# Patient Record
Sex: Female | Born: 1975
Health system: Southern US, Community
[De-identification: ages and names within clinical notes are randomized; demographics above are authoritative.]

## PROBLEM LIST (undated history)

## (undated) DIAGNOSIS — C801 Malignant (primary) neoplasm, unspecified: Secondary | ICD-10-CM

## (undated) DIAGNOSIS — T4145XA Adverse effect of unspecified anesthetic, initial encounter: Secondary | ICD-10-CM

## (undated) DIAGNOSIS — D259 Leiomyoma of uterus, unspecified: Secondary | ICD-10-CM

## (undated) DIAGNOSIS — T8859XA Other complications of anesthesia, initial encounter: Secondary | ICD-10-CM

## (undated) DIAGNOSIS — K429 Umbilical hernia without obstruction or gangrene: Secondary | ICD-10-CM

## (undated) DIAGNOSIS — O039 Complete or unspecified spontaneous abortion without complication: Secondary | ICD-10-CM

## (undated) DIAGNOSIS — N920 Excessive and frequent menstruation with regular cycle: Secondary | ICD-10-CM

## (undated) DIAGNOSIS — Z6741 Type O blood, Rh negative: Secondary | ICD-10-CM

## (undated) HISTORY — DX: Leiomyoma of uterus, unspecified: D25.9

## (undated) HISTORY — DX: Type O blood, Rh negative: Z67.41

## (undated) HISTORY — DX: Complete or unspecified spontaneous abortion without complication: O03.9

## (undated) HISTORY — DX: Umbilical hernia without obstruction or gangrene: K42.9

## (undated) HISTORY — DX: Excessive and frequent menstruation with regular cycle: N92.0

## (undated) HISTORY — PX: WISDOM TOOTH EXTRACTION: SHX21

---
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
## 1999-09-28 ENCOUNTER — Other Ambulatory Visit: Admission: RE | Admit: 1999-09-28 | Discharge: 1999-09-28 | Payer: Self-pay | Admitting: Obstetrics and Gynecology

## 2001-11-07 ENCOUNTER — Other Ambulatory Visit: Admission: RE | Admit: 2001-11-07 | Discharge: 2001-11-07 | Payer: Self-pay | Admitting: Obstetrics and Gynecology

## 2002-03-14 ENCOUNTER — Ambulatory Visit (HOSPITAL_COMMUNITY): Admission: RE | Admit: 2002-03-14 | Discharge: 2002-03-14 | Payer: Self-pay | Admitting: Obstetrics and Gynecology

## 2002-05-19 ENCOUNTER — Inpatient Hospital Stay (HOSPITAL_COMMUNITY): Admission: AD | Admit: 2002-05-19 | Discharge: 2002-05-19 | Payer: Self-pay | Admitting: Obstetrics and Gynecology

## 2002-05-21 ENCOUNTER — Inpatient Hospital Stay (HOSPITAL_COMMUNITY): Admission: AD | Admit: 2002-05-21 | Discharge: 2002-05-26 | Payer: Self-pay | Admitting: Obstetrics and Gynecology

## 2002-06-03 ENCOUNTER — Encounter: Admission: RE | Admit: 2002-06-03 | Discharge: 2002-07-03 | Payer: Self-pay | Admitting: Obstetrics and Gynecology

## 2002-07-03 ENCOUNTER — Other Ambulatory Visit: Admission: RE | Admit: 2002-07-03 | Discharge: 2002-07-03 | Payer: Self-pay | Admitting: Obstetrics and Gynecology

## 2003-07-21 ENCOUNTER — Other Ambulatory Visit: Admission: RE | Admit: 2003-07-21 | Discharge: 2003-07-21 | Payer: Self-pay | Admitting: Obstetrics and Gynecology

## 2003-11-05 ENCOUNTER — Ambulatory Visit (HOSPITAL_COMMUNITY): Admission: RE | Admit: 2003-11-05 | Discharge: 2003-11-05 | Payer: Self-pay | Admitting: Obstetrics and Gynecology

## 2004-02-03 ENCOUNTER — Inpatient Hospital Stay (HOSPITAL_COMMUNITY): Admission: RE | Admit: 2004-02-03 | Discharge: 2004-02-06 | Payer: Self-pay | Admitting: Obstetrics and Gynecology

## 2004-02-07 ENCOUNTER — Encounter: Admission: RE | Admit: 2004-02-07 | Discharge: 2004-03-08 | Payer: Self-pay | Admitting: Obstetrics and Gynecology

## 2004-03-08 ENCOUNTER — Other Ambulatory Visit: Admission: RE | Admit: 2004-03-08 | Discharge: 2004-03-08 | Payer: Self-pay | Admitting: Obstetrics and Gynecology

## 2004-03-09 ENCOUNTER — Encounter: Admission: RE | Admit: 2004-03-09 | Discharge: 2004-04-08 | Payer: Self-pay | Admitting: Obstetrics and Gynecology

## 2004-05-09 ENCOUNTER — Encounter: Admission: RE | Admit: 2004-05-09 | Discharge: 2004-06-08 | Payer: Self-pay | Admitting: Obstetrics and Gynecology

## 2004-06-09 ENCOUNTER — Encounter: Admission: RE | Admit: 2004-06-09 | Discharge: 2004-07-09 | Payer: Self-pay | Admitting: Obstetrics and Gynecology

## 2006-02-24 ENCOUNTER — Ambulatory Visit: Payer: Self-pay

## 2006-06-28 ENCOUNTER — Inpatient Hospital Stay: Payer: Self-pay | Admitting: Obstetrics & Gynecology

## 2009-03-25 ENCOUNTER — Ambulatory Visit: Payer: Self-pay | Admitting: Obstetrics and Gynecology

## 2009-03-25 HISTORY — PX: HYSTEROSCOPY: SHX211

## 2009-09-18 ENCOUNTER — Ambulatory Visit: Payer: Self-pay

## 2010-04-23 ENCOUNTER — Other Ambulatory Visit: Payer: Self-pay | Admitting: Obstetrics & Gynecology

## 2011-08-02 ENCOUNTER — Other Ambulatory Visit: Payer: Self-pay | Admitting: Internal Medicine

## 2011-08-02 LAB — CBC WITH DIFFERENTIAL/PLATELET
Basophil #: 0.1 10*3/uL (ref 0.0–0.1)
Basophil %: 1.3 %
Eosinophil #: 0.1 10*3/uL (ref 0.0–0.7)
Eosinophil %: 0.9 %
HCT: 41 % (ref 35.0–47.0)
HGB: 13.8 g/dL (ref 12.0–16.0)
Lymphocyte #: 1.8 10*3/uL (ref 1.0–3.6)
Lymphocyte %: 30.6 %
MCH: 31.5 pg (ref 26.0–34.0)
MCHC: 33.8 g/dL (ref 32.0–36.0)
MCV: 93 fL (ref 80–100)
Monocyte #: 0.5 10*3/uL (ref 0.0–0.7)
Monocyte %: 9 %
Neutrophil #: 3.4 10*3/uL (ref 1.4–6.5)
Neutrophil %: 58.2 %
Platelet: 192 10*3/uL (ref 150–440)
RBC: 4.39 10*6/uL (ref 3.80–5.20)
RDW: 12.6 % (ref 11.5–14.5)
WBC: 5.9 10*3/uL (ref 3.6–11.0)

## 2011-08-02 LAB — URINALYSIS, COMPLETE
Bilirubin,UR: NEGATIVE
Blood: NEGATIVE
Glucose,UR: NEGATIVE mg/dL (ref 0–75)
Ketone: NEGATIVE
Leukocyte Esterase: NEGATIVE
Nitrite: NEGATIVE
Ph: 8 (ref 4.5–8.0)
Protein: NEGATIVE
RBC,UR: 1 /HPF (ref 0–5)
Specific Gravity: 1.003 (ref 1.003–1.030)
Squamous Epithelial: 12
WBC UR: 3 /HPF (ref 0–5)

## 2013-10-29 ENCOUNTER — Ambulatory Visit (INDEPENDENT_AMBULATORY_CARE_PROVIDER_SITE_OTHER): Payer: 59 | Admitting: Podiatry

## 2013-10-29 ENCOUNTER — Encounter: Payer: Self-pay | Admitting: Podiatry

## 2013-10-29 ENCOUNTER — Ambulatory Visit (INDEPENDENT_AMBULATORY_CARE_PROVIDER_SITE_OTHER): Payer: 59

## 2013-10-29 ENCOUNTER — Other Ambulatory Visit: Payer: Self-pay | Admitting: *Deleted

## 2013-10-29 VITALS — BP 119/75 | HR 78 | Resp 16 | Ht 64.0 in | Wt 120.0 lb

## 2013-10-29 DIAGNOSIS — L600 Ingrowing nail: Secondary | ICD-10-CM

## 2013-10-29 DIAGNOSIS — M2040 Other hammer toe(s) (acquired), unspecified foot: Secondary | ICD-10-CM

## 2013-10-29 DIAGNOSIS — M2042 Other hammer toe(s) (acquired), left foot: Secondary | ICD-10-CM

## 2013-10-29 MED ORDER — NEOMYCIN-POLYMYXIN-HC 3.5-10000-1 OT SOLN: OTIC | Status: DC

## 2013-10-29 NOTE — Telephone Encounter (Signed)
Pt made an error and said [facility]. Should be [facility] for ear drops.

## 2013-10-29 NOTE — Patient Instructions (Signed)

## 2013-10-29 NOTE — Progress Notes (Signed)
   Subjective:    Patient ID: Jessica Murphy, female    DOB: [birth year], 38 y.o.   MRN: [id number]  HPI Comments: Two weeks ago the 4th toe on left foot hurt , it came on all of sudden ,but the 5th toe lateral corner has a callused lesion on the toe as well. i am a runner didn't know if that may be causing the problem      Review of Systems  All other systems reviewed and are negative.      Objective:   Physical Exam: I have reviewed her past medical history medications allergies surgeries social history and review of systems. Pulses are strongly palpable bilateral. Capillary fill time to digits one through 5 of the bilateral foot is immediate. Neurologic sensorium is intact per since once the monofilament bilateral foot. He can reflexes are intact bilateral muscle strength is 5 over 5 dorsiflexors plantar flexors inverters everters all intrinsic musculature is intact. Orthopedic evaluation demonstrates all joints distal to the ankle have a full range of motion without crepitation she does have adductovarus rotated hammertoe deformities fourth and fifth toes bilateral. Cutaneous evaluation demonstrates supple well hydrated cutis she has small hangnails and because of ingrown nail to the fibular border of the fourth and fifth digits of the left foot.        Assessment & Plan:  Assessment: Ingrown nails paronychia abscess lateral borders fourth and fifth toes left foot.  Plan: Chemical matrixectomy was tolerated well after local anesthetic was provided and fifth digits of the left foot she was her soaking on a twice a day basis and Betadine and water and apply Cortisporin otic as directed I will followup with her in one week

## 2013-11-05 ENCOUNTER — Ambulatory Visit: Payer: 59 | Admitting: Podiatry

## 2013-11-12 ENCOUNTER — Ambulatory Visit (INDEPENDENT_AMBULATORY_CARE_PROVIDER_SITE_OTHER): Payer: 59 | Admitting: Podiatry

## 2013-11-12 VITALS — BP 103/54 | HR 74 | Resp 16

## 2013-11-12 DIAGNOSIS — L600 Ingrowing nail: Secondary | ICD-10-CM

## 2013-11-12 NOTE — Progress Notes (Signed)
She presents today for followup of a matrixectomy to the fourth and fifth digits of the left foot. She states that they're a little bit sore and states that she has not been soaking than likely.  Objective: Vital signs are stable she is alert and oriented x3. There is no erythema edema saline is drainage or odor scab covered fibular borders for the matrixectomy for performed a vascular signs of infection.  Assessment: Well-healing surgical toes fourth and fifth of the left foot. Status post matrixectomy.  Plan: Suggested she start soaking in Epsom salts warm water and I will followup with her as needed. She will continue to soak and to completely healed.

## 2014-08-10 ENCOUNTER — Encounter: Payer: Self-pay | Admitting: *Deleted

## 2014-11-05 ENCOUNTER — Ambulatory Visit (INDEPENDENT_AMBULATORY_CARE_PROVIDER_SITE_OTHER): Payer: 59 | Admitting: Podiatry

## 2014-11-05 ENCOUNTER — Encounter: Payer: Self-pay | Admitting: Podiatry

## 2014-11-05 VITALS — BP 127/83 | HR 72 | Resp 12

## 2014-11-05 DIAGNOSIS — L600 Ingrowing nail: Secondary | ICD-10-CM

## 2014-11-05 DIAGNOSIS — L03012 Cellulitis of left finger: Secondary | ICD-10-CM

## 2014-11-05 MED ORDER — CEPHALEXIN 500 MG PO CAPS: 500.0000 mg | ORAL_CAPSULE | Freq: Three times a day (TID) | ORAL | Status: DC

## 2014-11-05 NOTE — Progress Notes (Signed)
   Subjective:    Patient ID: Jessica Murphy, female    DOB: Jun 08, 1975, 39 y.o.   MRN: [id number]  HPI  PT STATED LT FOOT GREAT TOENAIL SORE IS BEEN SORE 2.5 WEEKS. THE TOENAIL IS A LITTLE BETTER BUT WHEN PRESSURE IT GET WORSE. TRIED MOTRIN FOR 2 WEEKS BUT NO HELP.  Review of Systems  Skin: Positive for color change.       Objective:   Physical Exam: I have reviewed her past medical history medications allergy surgery social history and review of systems. Pulses are strongly palpable. Neurologic sensorium is intact per Semmes-Weinstein monofilament. Deep tendon reflexes are intact. Muscle strength full and equal bilateral. All intrinsic musculature of the foot is intact. Cutaneous evaluation demonstrates mild paronychia tibial border hallux left. Mild ingrown toenail. It appears to have an abrasion associated with it. Distal medial tuft.        Assessment & Plan:  Assessment: Paronychia ingrown nail hallux left.  Plan: At this point she states that she is going to the toes and cannot have anything invasive performed at this point. We both agreed that at the very least an antibiotic-coated be necessary. We prescribed Keflex 500 mg 1 by mouth 3 times a day 30 I also encouraged her to soak the toe in Epsom salts and warm water. I will follow-up with her when she returns.

## 2015-08-02 DIAGNOSIS — O09529 Supervision of elderly multigravida, unspecified trimester: Secondary | ICD-10-CM | POA: Diagnosis not present

## 2015-08-02 DIAGNOSIS — Z3480 Encounter for supervision of other normal pregnancy, unspecified trimester: Secondary | ICD-10-CM | POA: Diagnosis not present

## 2015-08-02 DIAGNOSIS — D259 Leiomyoma of uterus, unspecified: Secondary | ICD-10-CM | POA: Diagnosis not present

## 2015-08-02 DIAGNOSIS — O34219 Maternal care for unspecified type scar from previous cesarean delivery: Secondary | ICD-10-CM | POA: Diagnosis not present

## 2015-08-02 DIAGNOSIS — Z124 Encounter for screening for malignant neoplasm of cervix: Secondary | ICD-10-CM | POA: Diagnosis not present

## 2015-08-02 DIAGNOSIS — Z113 Encounter for screening for infections with a predominantly sexual mode of transmission: Secondary | ICD-10-CM | POA: Diagnosis not present

## 2015-08-02 DIAGNOSIS — O3411 Maternal care for benign tumor of corpus uteri, first trimester: Secondary | ICD-10-CM | POA: Diagnosis not present

## 2015-08-24 DIAGNOSIS — Z8759 Personal history of other complications of pregnancy, childbirth and the puerperium: Secondary | ICD-10-CM | POA: Diagnosis not present

## 2015-08-24 DIAGNOSIS — Z1231 Encounter for screening mammogram for malignant neoplasm of breast: Secondary | ICD-10-CM | POA: Diagnosis not present

## 2015-08-24 DIAGNOSIS — O039 Complete or unspecified spontaneous abortion without complication: Secondary | ICD-10-CM | POA: Diagnosis not present

## 2015-08-24 DIAGNOSIS — D259 Leiomyoma of uterus, unspecified: Secondary | ICD-10-CM | POA: Diagnosis not present

## 2015-08-24 DIAGNOSIS — O09529 Supervision of elderly multigravida, unspecified trimester: Secondary | ICD-10-CM | POA: Diagnosis not present

## 2015-08-24 DIAGNOSIS — Z36 Encounter for antenatal screening of mother: Secondary | ICD-10-CM | POA: Diagnosis not present

## 2015-10-27 DIAGNOSIS — D2272 Melanocytic nevi of left lower limb, including hip: Secondary | ICD-10-CM | POA: Diagnosis not present

## 2015-10-27 DIAGNOSIS — D2262 Melanocytic nevi of left upper limb, including shoulder: Secondary | ICD-10-CM | POA: Diagnosis not present

## 2015-10-27 DIAGNOSIS — D2261 Melanocytic nevi of right upper limb, including shoulder: Secondary | ICD-10-CM | POA: Diagnosis not present

## 2015-10-27 DIAGNOSIS — D485 Neoplasm of uncertain behavior of skin: Secondary | ICD-10-CM | POA: Diagnosis not present

## 2015-10-27 DIAGNOSIS — D225 Melanocytic nevi of trunk: Secondary | ICD-10-CM | POA: Diagnosis not present

## 2016-03-15 DIAGNOSIS — H5213 Myopia, bilateral: Secondary | ICD-10-CM | POA: Diagnosis not present

## 2016-10-25 DIAGNOSIS — D2261 Melanocytic nevi of right upper limb, including shoulder: Secondary | ICD-10-CM | POA: Diagnosis not present

## 2016-10-25 DIAGNOSIS — X32XXXA Exposure to sunlight, initial encounter: Secondary | ICD-10-CM | POA: Diagnosis not present

## 2016-10-25 DIAGNOSIS — D225 Melanocytic nevi of trunk: Secondary | ICD-10-CM | POA: Diagnosis not present

## 2016-10-25 DIAGNOSIS — L814 Other melanin hyperpigmentation: Secondary | ICD-10-CM | POA: Diagnosis not present

## 2016-12-01 ENCOUNTER — Ambulatory Visit (INDEPENDENT_AMBULATORY_CARE_PROVIDER_SITE_OTHER): Payer: 59 | Admitting: Obstetrics and Gynecology

## 2016-12-01 ENCOUNTER — Encounter: Payer: Self-pay | Admitting: Obstetrics and Gynecology

## 2016-12-01 VITALS — BP 126/76 | HR 78 | Ht 64.0 in | Wt 128.0 lb

## 2016-12-01 DIAGNOSIS — D252 Subserosal leiomyoma of uterus: Secondary | ICD-10-CM | POA: Diagnosis not present

## 2016-12-01 DIAGNOSIS — D251 Intramural leiomyoma of uterus: Secondary | ICD-10-CM | POA: Diagnosis not present

## 2016-12-01 DIAGNOSIS — N920 Excessive and frequent menstruation with regular cycle: Secondary | ICD-10-CM

## 2016-12-01 MED ORDER — MEDROXYPROGESTERONE ACETATE 10 MG PO TABS: 10.0000 mg | ORAL_TABLET | Freq: Every day | ORAL | 2 refills | Status: DC

## 2016-12-01 NOTE — Progress Notes (Signed)
Obstetrics & Gynecology Office Visit   Chief Complaint:  Chief Complaint  Patient presents with  . surgical consult    possible hysterectomy/heavy cycles    History of Present Illness: 41 year old female with a long standing history of worsening menorrhagia and dysmenorrhea.  Previous evaluation has included TVUS in the past year showing a posterior subserosal 4cm fibroid and a smaller 1cm anterior intramural fibroid.  The patient has previously trialed a Mirena IUD for management of her symptoms but had to eventually have this surgically removed and has not revisited the option of IUD. She has tried OCP's, with lower dose OCP's causing issues with breakthrough bleeding, higher dose OCP's causing migraines.     At present her menses are still regular monthly lasting approximately 7-8 days, with the first 2-3 days being particularly heavy with moderate to severe dysmenorrhea.  She has not recently been checked for anemia.  The patient is amenable to all interventions but would like to know what options and entail.     Review of Systems: 10 point review of systems negative unless otherwise noted in HPI  Past Medical History:  No past medical history on file.  Past Surgical History:  Past Surgical History:  Procedure Laterality Date  . CESAREAN SECTION      Gynecologic History: Patient's last menstrual period was 11/26/2016.  Obstetric History: No obstetric history on file.  Family History:  Family History  Problem Relation Age of Onset  . Alcohol abuse Father     Social History:  Social History   Social History  . Marital status: Married    Spouse name: N/A  . Number of children: N/A  . Years of education: N/A   Occupational History  . Not on file.   Social History Main Topics  . Smoking status: Never Smoker  . Smokeless tobacco: Never Used  . Alcohol use Not on file  . Drug use: Unknown  . Sexual activity: Not on file   Other Topics Concern  . Not on file    Social History Narrative  . No narrative on file    Allergies:  No Known Allergies  Medications: Prior to Admission medications   Medication Sig Start Date End Date Taking? Authorizing Provider  medroxyPROGESTERone (PROVERA) 10 MG tablet Take 1 tablet (10 mg total) by mouth daily. 12/01/16 12/31/16  Malachy Mood, MD    Physical Exam Vitals:  Vitals:   12/01/16 1433  BP: 126/76  Pulse: 78   Patient's last menstrual period was 11/26/2016.  General: NAD HEENT: normocephalic, anicteric Pulmonary: No increased work of breathing Abdomen: soft, non-tender, non-distended, small umbilical hernia with evidence of incarceration Neurologic: Grossly intact Psychiatric: mood appropriate, affect full  Female chaperone present for pelvic and breast  portions of the physical exam  Assessment: 41 y.o. menorrhagia, uterine fibroids  Plan: Problem List Items Addressed This Visit    None    Visit Diagnoses    Menorrhagia with regular cycle    -  Primary   Relevant Orders   CBC (Completed)   TSH (Completed)   Prolactin (Completed)   Intramural and subserous leiomyoma of uterus         Discussed management options for abnormal uterine bleeding including expectant, NSAIDs, tranexamic acid (Lysteda), oral progesterone (Provera, norethindrone, megace), Depo Provera, Levonorgestrel containing IUD, endometrial ablation (Novasure) or hysterectomy as definitive surgical management.  Discussed risks and benefits of each method.   Final management decision will hinge on results of patient's  work up and whether an underlying etiology for the patients bleeding symptoms can be discerned.  We will conduct a basic work up examining using the PALM-COIEN classification system.  In the meantime the patient opts to trial provera while we await results of her ultrasound and labs.  Printed patient education handouts were given to the patient to review at home.  Bleeding precautions reviewed.  - [place] last  year reviewed - Given size of fibroids likely not a great candidate for endometrial ablation, uterine artery embolizaiton would decrease size of fibroids but may not improve bleeding patter particularly if a degree of adenomyosis is present - umbilical hernia discussed likley postopone repair until final decision on hysterectomy A total of 15 minutes were spent in face-to-face contact with the patient during this encounter with over half of that time devoted to counseling and coordination of care.

## 2016-12-02 LAB — CBC
Hematocrit: 32.9 % — ABNORMAL LOW (ref 34.0–46.6)
Hemoglobin: 10.7 g/dL — ABNORMAL LOW (ref 11.1–15.9)
MCH: 27 pg (ref 26.6–33.0)
MCHC: 32.5 g/dL (ref 31.5–35.7)
MCV: 83 fL (ref 79–97)
Platelets: 293 10*3/uL (ref 150–379)
RBC: 3.97 x10E6/uL (ref 3.77–5.28)
RDW: 14.1 % (ref 12.3–15.4)
WBC: 6.1 10*3/uL (ref 3.4–10.8)

## 2016-12-02 LAB — PROLACTIN: Prolactin: 11.2 ng/mL (ref 4.8–23.3)

## 2016-12-02 LAB — TSH: TSH: 1.43 u[IU]/mL (ref 0.450–4.500)

## 2016-12-04 ENCOUNTER — Encounter: Payer: Self-pay | Admitting: Obstetrics and Gynecology

## 2016-12-04 ENCOUNTER — Telehealth: Payer: Self-pay

## 2016-12-04 NOTE — Telephone Encounter (Signed)
Pt calling to see if she should start oral depo depending on lab results.  [phone].

## 2016-12-05 NOTE — Telephone Encounter (Signed)
Please advise 

## 2016-12-18 ENCOUNTER — Encounter: Payer: Self-pay | Admitting: Obstetrics and Gynecology

## 2016-12-20 ENCOUNTER — Telehealth: Payer: Self-pay | Admitting: Obstetrics and Gynecology

## 2016-12-20 NOTE — Telephone Encounter (Signed)
Pt is schedule 12/29/16 at 8:00 am with Dr. Georgianne Fick

## 2016-12-22 NOTE — Telephone Encounter (Signed)
Noted. Will order to arrive by apt date/time. 

## 2016-12-28 NOTE — Progress Notes (Signed)
    ENDOMETRIAL BIOPSY     The indications for endometrial biopsy were reviewed.   Risks of the biopsy including cramping, bleeding, infection, uterine perforation, inadequate specimen and need for additional procedures  were discussed. The patient states she understands and agrees to undergo procedure today. Consent was signed. Time out was performed. Urine HCG was negative. A Graves speculum was placed and the cervix was brought into view.  The cervix was prepped with Betadine. A single-toothed tenaculum was  placed on the anterior lip of the cervix for traction. The cervix was very stenotic and had to be dilated, patient has had 3 C-sections, no prior cervical procedures.  A 3 mm pipelle was introduced through the cervix into the endometrial cavity with some difficulty to a depth of 8cm, and a small amount of tissue was obtained, the resulting specime sent to pathology. The instruments were removed from the patient's vagina. Minimal bleeding from the cervix was noted. The patient tolerated the procedure well. Routine post-procedure instructions were given to the patient.  She will be contacted by phone one results become available.     IUD not inserted because of difficulty dilating.    Given inability to place IUD, intolerance to norethindrone, and number and size of fibroid the patient was not an ideal candidate for UA or endometrial ablation. Patient opts for definitive surgical management via hysterectomy. The risks of surgery were discussed in detail with the patient including but not limited to: bleeding which may require transfusion or reoperation; infection which may require antibiotics; injury to bowel, bladder, ureters or other surrounding organs (With a literature reported rate of urinary tract injury of 1% quoted); need for additional procedures including laparotomy; thromboembolic phenomenon, incisional problems and other postoperative/anesthesia complications.  Patient was also advised that  recovery procedure generally involves an overnight stay; and the  expected recovery time after a hysterectomy being in the range of 6-8 weeks.  Likelihood of success in alleviating the patient's symptoms was discussed.  While definitive in regards to issues with menstural bleeding, pelvic pain if present preoperatively may continue and in fact worsen postoperatively.  She is aware that the procedure will render her unable to pursue childbearing in the future.   She was told that she will be contacted by our surgical scheduler regarding the time and date of her surgery; routine preoperative instructions of having nothing to eat or drink after midnight on the day prior to surgery and also coming to the hospital 1.5 hours prior to her time of surgery were also emphasized.  She was told she may be called for a preoperative appointment about a week prior to surgery and will be given further preoperative instructions at that visit.  Routine postoperative instructions will be reviewed with the patient and her family in detail after surgery. Printed patient education handouts about the procedure was given to the patient to review at home.   Malachy Mood, MD, [facility], [facility]

## 2016-12-29 ENCOUNTER — Encounter: Payer: Self-pay | Admitting: Obstetrics and Gynecology

## 2016-12-29 ENCOUNTER — Ambulatory Visit (INDEPENDENT_AMBULATORY_CARE_PROVIDER_SITE_OTHER): Payer: 59 | Admitting: Obstetrics and Gynecology

## 2016-12-29 VITALS — BP 122/80 | HR 79 | Wt 128.0 lb

## 2016-12-29 DIAGNOSIS — N920 Excessive and frequent menstruation with regular cycle: Secondary | ICD-10-CM

## 2016-12-29 DIAGNOSIS — D251 Intramural leiomyoma of uterus: Secondary | ICD-10-CM | POA: Diagnosis not present

## 2016-12-29 DIAGNOSIS — Z30430 Encounter for insertion of intrauterine contraceptive device: Secondary | ICD-10-CM

## 2017-01-02 LAB — PATHOLOGY

## 2017-01-09 NOTE — Telephone Encounter (Signed)
Pt has been seen 

## 2017-01-10 ENCOUNTER — Encounter: Payer: Self-pay | Admitting: Obstetrics and Gynecology

## 2017-01-10 ENCOUNTER — Telehealth: Payer: Self-pay | Admitting: Obstetrics and Gynecology

## 2017-01-10 NOTE — Telephone Encounter (Signed)
-----   Message from Malachy Mood, MD sent at 12/29/2016 10:26 PM EDT ----- Regarding: Surgery Surgery Date: December  LOS: Observation  Surgery Booking Request Patient Full Name: Jessica Murphy MRN: [id number]  DOB: [birth year]  Surgeon: Malachy Mood, MD  Requested Surgery Date and Time: December Primary Diagnosis and Code: Menorrhagia Secondary Diagnosis and Code: Uterine fibroids Surgical Procedure: TLH, BS, and cystoscopy L&D Notification:N/A Admission Status: same day surgery Length of Surgery: 2hrs Special Case Needs: none H&P:  (date) Phone Interview or Office Pre-Admit: pre-admit Interpreter: none Language: English Medical Clearance: none Special Scheduling Instructions: none

## 2017-01-10 NOTE — Telephone Encounter (Signed)
Patient is aware of H&P on 04/09/17 @ 11:30am at [facility] w/ Dr. Georgianne Fick, [place] afterwards, and OR on 04/12/17. Ext given.

## 2017-01-10 NOTE — Telephone Encounter (Signed)
Lmtrc

## 2017-01-11 ENCOUNTER — Other Ambulatory Visit: Payer: Self-pay | Admitting: Advanced Practice Midwife

## 2017-01-11 DIAGNOSIS — N390 Urinary tract infection, site not specified: Secondary | ICD-10-CM

## 2017-01-11 MED ORDER — CEPHALEXIN 500 MG PO CAPS: 500.0000 mg | ORAL_CAPSULE | Freq: Two times a day (BID) | ORAL | 0 refills | Status: DC

## 2017-02-14 DIAGNOSIS — N920 Excessive and frequent menstruation with regular cycle: Secondary | ICD-10-CM | POA: Diagnosis not present

## 2017-02-14 DIAGNOSIS — D251 Intramural leiomyoma of uterus: Secondary | ICD-10-CM | POA: Diagnosis not present

## 2017-03-06 ENCOUNTER — Telehealth: Payer: Self-pay

## 2017-03-06 ENCOUNTER — Other Ambulatory Visit: Payer: Self-pay | Admitting: Obstetrics and Gynecology

## 2017-03-06 MED ORDER — NITROFURANTOIN MONOHYD MACRO 100 MG PO CAPS: 100.0000 mg | ORAL_CAPSULE | Freq: Two times a day (BID) | ORAL | 1 refills | Status: DC

## 2017-03-06 NOTE — Telephone Encounter (Signed)
Pt states she woke up this morning with a UTI and is at work today. Pt requests rx sent to [facility] employee pharmacy. Cb# [phone] thank you

## 2017-03-07 ENCOUNTER — Ambulatory Visit: Payer: 59 | Admitting: Obstetrics and Gynecology

## 2017-03-13 ENCOUNTER — Telehealth: Payer: Self-pay | Admitting: Obstetrics and Gynecology

## 2017-03-13 ENCOUNTER — Encounter: Payer: Self-pay | Admitting: Obstetrics and Gynecology

## 2017-03-13 DIAGNOSIS — Z30430 Encounter for insertion of intrauterine contraceptive device: Secondary | ICD-10-CM | POA: Diagnosis not present

## 2017-03-13 DIAGNOSIS — N920 Excessive and frequent menstruation with regular cycle: Secondary | ICD-10-CM | POA: Diagnosis not present

## 2017-03-13 NOTE — Telephone Encounter (Signed)
That's fine if she wants to see how she does with the IUD

## 2017-03-13 NOTE — Telephone Encounter (Signed)
Patient wants to cancel her surgery.  She went to [facility] and they were able to have the IUD placed.

## 2017-03-15 NOTE — Telephone Encounter (Signed)
Pt received macrobid on 03/06/17

## 2017-04-03 DIAGNOSIS — H5213 Myopia, bilateral: Secondary | ICD-10-CM | POA: Diagnosis not present

## 2017-04-09 ENCOUNTER — Other Ambulatory Visit: Payer: Self-pay

## 2017-04-09 ENCOUNTER — Encounter: Payer: 59 | Admitting: Obstetrics and Gynecology

## 2017-04-12 ENCOUNTER — Ambulatory Visit: Admit: 2017-04-12 | Payer: 59 | Admitting: Obstetrics and Gynecology

## 2017-04-12 SURGERY — HYSTERECTOMY, TOTAL, LAPAROSCOPIC, WITH SALPINGECTOMY
Anesthesia: Choice

## 2017-04-25 DIAGNOSIS — Z30431 Encounter for routine checking of intrauterine contraceptive device: Secondary | ICD-10-CM | POA: Diagnosis not present

## 2017-06-13 DIAGNOSIS — R002 Palpitations: Secondary | ICD-10-CM | POA: Diagnosis not present

## 2017-06-13 DIAGNOSIS — F419 Anxiety disorder, unspecified: Secondary | ICD-10-CM | POA: Diagnosis not present

## 2017-07-11 DIAGNOSIS — F419 Anxiety disorder, unspecified: Secondary | ICD-10-CM | POA: Diagnosis not present

## 2017-10-09 DIAGNOSIS — J22 Unspecified acute lower respiratory infection: Secondary | ICD-10-CM | POA: Diagnosis not present

## 2017-11-27 ENCOUNTER — Ambulatory Visit: Payer: 59 | Admitting: Certified Nurse Midwife

## 2017-12-04 ENCOUNTER — Other Ambulatory Visit (HOSPITAL_COMMUNITY)
Admission: RE | Admit: 2017-12-04 | Discharge: 2017-12-04 | Disposition: A | Discharge status: Home / Self Care | Payer: 59 | Source: Ambulatory Visit | Attending: Certified Nurse Midwife | Admitting: Certified Nurse Midwife

## 2017-12-04 ENCOUNTER — Ambulatory Visit (INDEPENDENT_AMBULATORY_CARE_PROVIDER_SITE_OTHER): Payer: 59 | Admitting: Certified Nurse Midwife

## 2017-12-04 ENCOUNTER — Encounter: Payer: Self-pay | Admitting: Certified Nurse Midwife

## 2017-12-04 VITALS — BP 100/60 | HR 66 | Ht 64.0 in | Wt 130.8 lb

## 2017-12-04 DIAGNOSIS — N920 Excessive and frequent menstruation with regular cycle: Secondary | ICD-10-CM | POA: Insufficient documentation

## 2017-12-04 DIAGNOSIS — Z1231 Encounter for screening mammogram for malignant neoplasm of breast: Secondary | ICD-10-CM | POA: Diagnosis not present

## 2017-12-04 DIAGNOSIS — Z131 Encounter for screening for diabetes mellitus: Secondary | ICD-10-CM | POA: Diagnosis not present

## 2017-12-04 DIAGNOSIS — Z01419 Encounter for gynecological examination (general) (routine) without abnormal findings: Secondary | ICD-10-CM

## 2017-12-04 DIAGNOSIS — Z800 Family history of malignant neoplasm of digestive organs: Secondary | ICD-10-CM

## 2017-12-04 DIAGNOSIS — Z6741 Type O blood, Rh negative: Secondary | ICD-10-CM | POA: Insufficient documentation

## 2017-12-04 DIAGNOSIS — Z1239 Encounter for other screening for malignant neoplasm of breast: Secondary | ICD-10-CM

## 2017-12-04 DIAGNOSIS — Z01411 Encounter for gynecological examination (general) (routine) with abnormal findings: Secondary | ICD-10-CM

## 2017-12-04 DIAGNOSIS — Z13220 Encounter for screening for lipoid disorders: Secondary | ICD-10-CM | POA: Diagnosis not present

## 2017-12-04 DIAGNOSIS — K429 Umbilical hernia without obstruction or gangrene: Secondary | ICD-10-CM | POA: Diagnosis not present

## 2017-12-04 DIAGNOSIS — Z124 Encounter for screening for malignant neoplasm of cervix: Secondary | ICD-10-CM

## 2017-12-04 DIAGNOSIS — D259 Leiomyoma of uterus, unspecified: Secondary | ICD-10-CM

## 2017-12-04 NOTE — Progress Notes (Signed)
Gynecology Annual Exam  PCP: Patient, No Pcp Per  Chief Complaint:  Chief Complaint  Patient presents with  . Gynecologic Exam    mammogram; when to get colonoscopy; unbilical hernia    History of Present Illness:Jessica Murphy is a 42 year old Caucasian/White female , G 5 P 3 0 2 3 , who presents for her annual exam . She would like to be referred to Dr Burt Knack for repair of her umbilical hernia. She complains of intermittent tenderness in her umbilical area due to her hernia which she developed 11 years ago during her third pregnancy.  She has a history of menorrhagia and fibroids and was contemplating having a hysterectomy last year. Her menses are now absent due to a Mirena IUD that was inserted 03/13/2017 at [facility] (Dr Clide Dales) and her LMP was March 2019 .She has had one episode of spotting a couple of weeks ago.   She denies cramping. The patient's past medical history is notable for a history of three C-sections..  Since her last annual GYN exam dated 08/02/2015, she was begun on Lexapro by her PCP for anxiety/ panic disorder and she has been doing well. 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 She is sexually active. She has the Mirena IUD for contraception. Her most recent pap smear was obtained 08/02/2015 and was NIL She had a mammogram 08/24/2015 and it was benign. She has extremely dense breasts  There is a positive history of breast cancer in her cousin. Genetic testing was done on the cousin's identical twin sister and that was negative. There is no family history of ovarian cancer.  The patient does not do monthly self breast exams.  The patient does not smoke.  The patient does drink occasionally.  The patient does not use illegal drugs.  The patient exercises regularly. Cardio 3x/week The patient does get adequate calcium in her diet.  She had a cholesterol screen in 2013 that was normal.   Review of Systems: Review of  Systems  Constitutional: Negative for chills, fever and weight loss.  HENT: Negative for congestion, sinus pain and sore throat.   Eyes: Negative for blurred vision and pain.  Respiratory: Negative for hemoptysis, shortness of breath and wheezing.   Cardiovascular: Negative for chest pain, palpitations and leg swelling.  Gastrointestinal: Negative for abdominal pain, blood in stool, diarrhea, heartburn, nausea and vomiting.       Positive for umbilical hernia  Genitourinary: Negative for dysuria, frequency, hematuria and urgency.  Musculoskeletal: Negative for back pain, joint pain and myalgias.  Skin: Negative for itching and rash.  Neurological: Negative for dizziness, tingling and headaches.  Endo/Heme/Allergies: Negative for environmental allergies and polydipsia. Does not bruise/bleed easily.       Negative for hirsutism   Psychiatric/Behavioral: Negative for depression. The patient is not nervous/anxious and does not have insomnia.     Past Medical History:  Past Medical History:  Diagnosis Date  . Blood type O-   . Menorrhagia   . Spontaneous abortion    END OF 2011/2012, 2017  . Umbilical hernia   . Uterine fibroid     Past Surgical History:  Past Surgical History:  Procedure Laterality Date  . CESAREAN SECTION     X3  . HYSTEROSCOPY  03/25/2009   HYSTEROSCOPIC IUD REMOVAL  . WISDOM TOOTH EXTRACTION      Family History:  Family History  Problem Relation  Age of Onset  . Congestive Heart Failure Mother   . Hypertension Mother   . Thyroid disease Mother        HYPO  . Lupus Mother        ERYTHEMATOSUS  . Rheum arthritis Mother   . Sjogren's syndrome Mother   . Colitis Mother        collagenous  . Alcohol abuse Father        DECEASED August 02, 2009  . Cancer Maternal Grandmother 58       COLON  . Heart disease Maternal Grandmother   . Thyroid disease Maternal Grandmother   . Heart disease Maternal Grandfather   . Diabetes Paternal Grandfather        TYPE 2  .  Heart disease Paternal Grandfather   . Cancer Cousin 44       BREAST  . Diabetes Son        TYPE 1  . Thyroid disease Maternal Aunt     Social History:  Social History   Socioeconomic History  . Marital status: Married    Spouse name: Not on file  . Number of children: 3  . Years of education: 63  . Highest education level: Not on file  Occupational History  . Occupation: NURSE  Social Needs  . Financial resource strain: Not on file  . Food insecurity:    Worry: Not on file    Inability: Not on file  . Transportation needs:    Medical: Not on file    Non-medical: Not on file  Tobacco Use  . Smoking status: Former Research scientist (life sciences)  . Smokeless tobacco: Never Used  . Tobacco comment: QUIT 2002  Substance and Sexual Activity  . Alcohol use: Yes    Comment: occ  . Drug use: No  . Sexual activity: Yes    Partners: Male    Birth control/protection: IUD  Lifestyle  . Physical activity:    Days per week: Not on file    Minutes per session: Not on file  . Stress: Not on file  Relationships  . Social connections:    Talks on phone: Not on file    Gets together: Not on file    Attends religious service: Not on file    Active member of club or organization: Not on file    Attends meetings of clubs or organizations: Not on file    Relationship status: Not on file  . Intimate partner violence:    Fear of current or ex partner: Not on file    Emotionally abused: Not on file    Physically abused: Not on file    Forced sexual activity: Not on file  Other Topics Concern  . Not on file  Social History Narrative  . Not on file    Allergies:  Allergies  Allergen Reactions  . Guaifenesin Palpitations    Medications:  Current Outpatient Medications on File Prior to Visit  Medication Sig Dispense Refill  . escitalopram (LEXAPRO) 5 MG tablet Take 5 mg by mouth daily.    Marland Kitchen levonorgestrel (MIRENA) 20 MCG/24HR IUD 1 Intra Uterine Device by Intrauterine route continuous.     No  current facility-administered medications on file prior to visit.    Physical Exam Vitals: BP 100/60   Pulse 66   Ht 5\' 4"  (1.626 m)   Wt 130 lb 12 oz (59.3 kg)   LMP  (LMP Unknown) Comment: mirena  BMI 22.44 kg/m   General:WF in  NAD HEENT: normocephalic, anicteric  Neck: no thyroid enlargement, no palpable nodules, no cervical lymphadenopathy  Pulmonary: No increased work of breathing, CTAB Cardiovascular: RRR, without murmur  Breast: Breast symmetrical, no tenderness, no palpable nodules or masses, no skin or nipple retraction present, no nipple discharge.  No axillary, infraclavicular or supraclavicular lymphadenopathy. Abdomen: Soft, non-tender, non-distended.  Umbilical hernia just below the umbilicus.  No hepatomegaly or masses palpable.  Genitourinary:  External: Normal external female genitalia.  Normal urethral meatus, normal Bartholin's and Skene's glands.    Vagina: Normal vaginal mucosa, no lesions   Cervix: Grossly normal in appearance, no bleeding, non-tender, ?IUD string at cx os  Uterus: Anteflexed, TLNS, slightly irregular contour, mobile, NT  Adnexa: No adnexal masses, non-tender  Rectal: deferred  Lymphatic: no evidence of inguinal lymphadenopathy Extremities: no edema, erythema, or tenderness Neurologic: Grossly intact Psychiatric: mood appropriate, affect full     Assessment: 42 y.o. [id number] well woman exam. Umbilical hernia Family history of colon cancer/ mother with inflammatory bowel disease and adenomatous polyp.  Plan:   1) Breast cancer screening - recommend monthly self breast exam and annual screening 3D mammograms. Mammogram was ordered today.  2) Colon cancer screening: recommend colonoscopies starting at age 60 due to family history  3) Cervical cancer screening - Pap was done. ASCCP guidelines and rational discussed.    4) Contraception -Mirena  5) Routine healthcare maintenance including cholesterol and diabetes screening ordered  today

## 2017-12-05 LAB — CYTOLOGY - PAP
Diagnosis: NEGATIVE
HPV: NOT DETECTED

## 2017-12-12 ENCOUNTER — Ambulatory Visit: Payer: 59 | Admitting: Surgery

## 2017-12-14 DIAGNOSIS — M545 Low back pain: Secondary | ICD-10-CM | POA: Diagnosis not present

## 2018-01-16 ENCOUNTER — Encounter: Payer: Self-pay | Admitting: Surgery

## 2018-01-31 ENCOUNTER — Encounter: Payer: Self-pay | Admitting: *Deleted

## 2018-02-07 ENCOUNTER — Ambulatory Visit: Payer: 59 | Attending: Family Medicine | Admitting: Physical Therapy

## 2018-02-07 DIAGNOSIS — M545 Low back pain, unspecified: Secondary | ICD-10-CM

## 2018-02-07 DIAGNOSIS — G8929 Other chronic pain: Secondary | ICD-10-CM | POA: Insufficient documentation

## 2018-02-07 NOTE — Therapy (Signed)
PT/OT/SLP Screening Form   Time: in__7:35____     Time out_8:08__   Complaint ___R lower back and buttocks pain________________ Past Medical Hx:  ___H/o self-diagnosed piriformis syndrome for several years___________ Injury Date:___Piriformis pain for several years, LBP for ~1 year________________  Pain Scale: __ _Constant dull achy in piriformis: 1-2/10; Current R LBP: 0/10; Worst LBP: 6/10___ Patient's phone number: [phone] (cell)  Hx (this occurrence):  Pt reports she has had R piriformis pain for several years but she is primarily here for onset of R LBP that began ~1 year ago that worsened over this summer. Before onset of this pain pt would run 3x/wk (3-4 miles) and spin 3x/wk, has since stopped running routinely, still runs 1x/wk. No pain with spin but has pain in R lower back after running ~10 minutes. Pain wakes her up at night if she sleeps on her L side (does not use a pillow between knees). Pt has a lipoma in her R lower back confirmed by her MD and MD says this is unrelated; however, pain is very specifically felt in this spot. Easing factors: heat, meloxicam prn. Aggravating factors: laying on L side, running. Has bought new shoes and thinks this helped. Denies numbness and tingling. Achy feeling in R piriformis is constant at 1-2/10 pain. Pt denies h/o LBP.     Assessment: Trunk AROM WNL in all directions but painful with trunk extension and sharp pain in R LBP at site of reported pain with lateral F to the R.   Did not test repeated F or E as pt no pain at present.   MMT: WNL BLE except 3-/5 R glute max   Special Tests:  SLR Test: negative Bil   FABER Test: negative Bil   FADDIR Test: pain in R lower back with testing on L (likely due to shift of pelvis), otherwise negative   Ely's Test: positive on R   SIJ Compression and Distraction Test: negative   Slump Test: negative BLE   Hamstring length: WNL BLE   Mobility: hypomobile and painful R UPA to L3-4    Palpation: TTP R piriformis, QL. No TTP at site of lipoma.      Recommendations:   Continue with painfree piriformis stretch but do this on a daily basis x2 Continue with heat prn for pain relief.  Can try ice to assess effect on pain.  Place pillow between knees when sleeping on side.  Comments: Next visit: STM to R piriformis and QL. UPA mobs to L3-4.  Instruct pt in pre-workout dynamic stretching routine and cool down static stretching routine.  Strengthening of R glute max.   [x]  Patient would benefit from an MD referral [x]  Patient would benefit from a full PT evaluation and treatment. []  No intervention recommended at this time.

## 2018-02-12 ENCOUNTER — Ambulatory Visit: Payer: 59 | Admitting: Physical Therapy

## 2018-02-13 ENCOUNTER — Ambulatory Visit: Payer: 59 | Admitting: Physical Therapy

## 2018-02-13 DIAGNOSIS — G8929 Other chronic pain: Secondary | ICD-10-CM | POA: Diagnosis not present

## 2018-02-13 DIAGNOSIS — M545 Low back pain, unspecified: Secondary | ICD-10-CM

## 2018-02-14 ENCOUNTER — Ambulatory Visit: Payer: 59 | Admitting: Physical Therapy

## 2018-02-14 ENCOUNTER — Other Ambulatory Visit: Payer: Self-pay

## 2018-02-14 ENCOUNTER — Encounter: Payer: Self-pay | Admitting: Physical Therapy

## 2018-02-14 ENCOUNTER — Ambulatory Visit
Admission: RE | Admit: 2018-02-14 | Discharge: 2018-02-14 | Disposition: A | Discharge status: Home / Self Care | Payer: 59 | Source: Ambulatory Visit | Attending: Certified Nurse Midwife | Admitting: Certified Nurse Midwife

## 2018-02-14 DIAGNOSIS — Z1239 Encounter for other screening for malignant neoplasm of breast: Secondary | ICD-10-CM | POA: Insufficient documentation

## 2018-02-14 DIAGNOSIS — Z1231 Encounter for screening mammogram for malignant neoplasm of breast: Secondary | ICD-10-CM | POA: Diagnosis not present

## 2018-02-14 NOTE — Patient Instructions (Signed)
HEP via [employer]:  Diaphragmatic Breathing Pelvic Tilts Supine TA with marching  Deadbug Abdominal prep Curl up on 9" ball Standing Multifidi engagement

## 2018-02-14 NOTE — Therapy (Signed)
[place] MAIN [facility] SERVICES [address] [place], [place], [address] Phone: [phone]   Fax:  [phone]  Physical Therapy Evaluation  Patient Details  Name: Jessica Murphy MRN: [id number] Date of Birth: 1975-07-07 Referring Provider (PT): Rhina Brackett MD   Encounter Date: 02/13/2018  PT End of Session - 02/14/18 1127    Visit Number  1    Number of Visits  5    Date for PT Re-Evaluation  03/14/18    PT Start Time  1700    PT Stop Time  1753    PT Time Calculation (min)  53 min    Activity Tolerance  Patient tolerated treatment well    Behavior During Therapy  [facility] for tasks assessed/performed       Past Medical History:  Diagnosis Date  . Blood type O-   . Menorrhagia   . Spontaneous abortion    END OF 2011/2012, 2017  . Umbilical hernia   . Uterine fibroid     Past Surgical History:  Procedure Laterality Date  . CESAREAN SECTION     X3  . HYSTEROSCOPY  03/25/2009   HYSTEROSCOPIC IUD REMOVAL  . WISDOM TOOTH EXTRACTION      There were no vitals filed for this visit.   Subjective Assessment - 02/13/18 1703    Subjective  Patient is an energetic 42 year old female presenting to clinic with c/o low back pain (R sided) that is worsened during running, which is a primary source of stress management for her. She states that she cannot think of anything specific that happened, but does recall that the problem started after she tried [employer] workouts for ~4 weeks last spring (~March/April 2019). Patient states "this isn't really a big problem" and expressed motivation to manage the pain with prescribed exercises after today's evaluation. Patient states that she knows she should stretch and warm-up, but can't seem to commit to it consistently.    Pertinent History  Pt confirmed the following information from the screen: reports she has had R piriformis pain for several years but she is primarily here for onset of R LBP that  began ~1 year ago that worsened over this summer. Before onset of this pain pt would run 3x/wk (3-4 miles) and spin 3x/wk, has since stopped running routinely, still runs 1x/wk. No pain with spin but has pain in R lower back after running ~10 minutes. Pain wakes her up at night if she sleeps on her L side (does not use a pillow between knees). Pt has a lipoma in her R lower back confirmed by her MD and MD says this is unrelated; however, pain is very specifically felt in this spot. Easing factors: heat, meloxicam prn. Aggravating factors: laying on L side, running. Has bought new shoes and thinks this helped. Denies numbness and tingling. Achy feeling in R piriformis is constant at 1-2/10 pain. Pt denies h/o LBP.     Limitations  Lifting;Walking;House hold activities    How long can you sit comfortably?  unlimited    How long can you stand comfortably?  no problems reported    How long can you walk comfortably?  1/2 mile    Diagnostic tests  XRay (no indication of fx), [place] (lipoma identified)    Currently in Pain?  Yes    Pain Score  3     Pain Location  Back    Pain Orientation  Right;Lower    Pain Descriptors / Indicators  Aching;Spasm    Pain Type  Chronic pain    Pain Onset  More than a month ago    Pain Frequency  Intermittent    Aggravating Factors   twisting, running, Ext-rotation R>L, lateral flexion    Pain Relieving Factors  heat, meloxacam, flexion stretching    Effect of Pain on Daily Activities  decreased activity level overall, avoiding running    Multiple Pain Sites  Yes    Pain Score  4    Pain Location  Sacrum    Pain Orientation  Right;Lateral    Pain Descriptors / Indicators  Aching    Pain Type  Chronic pain    Pain Radiating Towards  n/a    Pain Onset  More than a month ago    Pain Frequency  Intermittent    Aggravating Factors   running    Pain Relieving Factors  heat, stretching    Effect of Pain on Daily Activities  decreased activity level overall, avoiding running          [facility] PT Assessment - 02/14/18 0001      Assessment   Medical Diagnosis  chronic right-sided low back without sciatica  (Pended)     Referring Provider (PT)  Rhina Brackett MD    Onset Date/Surgical Date  02/14/17  (Pended)     Hand Dominance  Right  (Pended)       Precautions   Precautions  None  (Pended)       Restrictions   Weight Bearing Restrictions  No  (Pended)       Balance Screen   Has the patient fallen in the past 6 months  No  (Pended)       [place] residence  (Pended)     Living Arrangements  Spouse/significant other;Children  (Pended)     Type of Home  House  (Pended)     Home Access  Stairs to enter  (Pended)     Entrance Stairs-Number of Steps  2  (Pended)     Entrance Stairs-Rails  Right  (Pended)     Home Layout  Two level  (Pended)       Prior Function   Level of Independence  Independent  (Pended)       Cognition   Overall Cognitive Status  Within Functional Limits for tasks assessed  (Pended)       Sensation   Light Touch  Appears Intact  (Pended)       Coordination   Gross Motor Movements are Fluid and Coordinated  Yes  (Pended)     Fine Motor Movements are Fluid and Coordinated  Yes  (Pended)       Posture/Postural Control   Posture/Postural Control  Postural limitations  (Pended)       PER SCREENING (02/07/2018) Trunk AROM WNL in all directions but painful with trunk extension and sharp pain in R LBP at site of reported pain with lateral F to the R.   Did not test repeated F or E as pt no pain at present.   MMT: WNL BLE except 3-/5 R glute max   Special Tests:  SLR Test: negative Bil   FABER Test: negative Bil   FADDIR Test: pain in R lower back with testing on L (likely due to shift of pelvis), otherwise negative   Ely's Test: positive on R   SIJ Compression and Distraction Test: negative   Slump Test: negative BLE  Hamstring length: WNL BLE   Mobility: hypomobile  and painful R UPA to L3-4   Palpation: TTP R piriformis, QL. No TTP at site of lipoma.   02/13/2018 Evaluation  Ext-rotation, painful, R>L  L lumbar paraspinals increased time to activate in prone R leg lift R lumbar paraspinals decreased time to activate in prone L leg lift  MMT Trunk Flexion 3/5 MMT glute max 3+/5 B *Observable rib flare B, pt has minimal diastasis recti but no coning occurred during trunk flexion MMT. Pt relies on chest breathing predominantly.  Objective measurements completed on examination: See above findings.   TREATMENT  Therapeutic Exercise:  Diaphragmatic Breathing Pelvic Tilts Supine TA with marching, requires VCs to maximize lumbopelvic control Deadbug x10, 2# dumbbells, requires VCs for slow controlled movement and to ensure upper abdominal engagement Abdominal prep (chest lift) x10 Curl up on 9" ball x10 Standing Multifidi engagement x15  Patient educated on standing multifidi motor exercises, HEP, postural awareness, and appropriate exercise technique.   Manual Therapy: STM to R QL and R lumbar paraspinals, increased muscle tone/bulk on R lumbar paraspinals CPA/UPA mobs to L2-L5, 30 sec x 3 (mild relief reported after each bout, but no overall change intra-session)   PT Education - 02/14/18 1124    Education Details  proper technique, prognosis, activity modification, HEP    Person(s) Educated  Patient    Methods  Explanation;Demonstration;Tactile cues;Verbal cues;Handout    Comprehension  Verbalized understanding;Returned demonstration;Verbal cues required;Need further instruction          PT Long Term Goals - 02/14/18 1129      PT LONG TERM GOAL #1   Title  Patient will demonstrate improved R glute max strength to 4/5 in order to increase her tolerance to SLS activities such as running.    Baseline  3+/5 (02/13/2018)    Time  4    Period  Weeks    Status  New    Target Date  03/14/18      PT LONG TERM GOAL #2   Title  Patient  will demonstrate improved trunk flexion strength to 4/5 in order to increase her tolerance to SLS activities: running.     Baseline  3/5 (02/13/2018)    Time  4    Period  Weeks    Status  New    Target Date  03/14/18      PT LONG TERM GOAL #3   Title  Patient will decrease R low back pain to 0/10 in order to return to her PLOF.    Baseline  4/10 (02/13/2018)    Time  4    Period  Weeks    Status  New    Target Date  03/14/18      PT LONG TERM GOAL #4   Title  Patient will be independent with her HEP in order to optimize therapeutic gains and return to PLOF.    Baseline  Not initiated (02/13/2018)    Time  4    Period  Weeks    Status  New    Target Date  03/14/18             Plan - 02/14/18 1139    Clinical Impression Statement Patient is an energetic 42 year old female presenting to clinic with c/o of R sided low back pain during running and other high level activities. Upon evaluation deficits in joint mobility of the lumbar spine, increased pain (4/10), decreased strength (trunk flexion 3/5, R glute max  3+/5), and decreased motor control of the lumbar paraspinals and upper abdominals were revealed. Additionally, patient relies on chest breathing with limited diaphragmatic engagement and rests in a lordotic posture in standing. The patient has an excellent prognosis 2/2 to her motivation to address aforementioned deficits and return to PLOF and improve overall QOL. She will benefit from skilled therapeutic intervention to decrease pain, increase strength, and return to running at her prior level.   History and Personal Factors relevant to plan of care:  strong support, independent, high health literacy, very active lifestyle, chronic LBP, hx of 3 c-sections    Clinical Presentation  Stable    Clinical Presentation due to:  independent and high functioning, no radiating s/s, predictable pain pattern    Clinical Decision Making  Low    Rehab Potential  Excellent    Clinical  Impairments Affecting Rehab Potential  chronic pain, decreased function, decreased strength, lordotic posture    PT Frequency  1x / week    PT Duration  4 weeks    PT Treatment/Interventions Cryotherapy;Moist Heat;Ultrasound;Electrical Stimulation;Balance training;Neuromuscular re-education;Gait training;Therapeutic exercise;Therapeutic activities;Patient/family education;Manual techniques;Joint Manipulations;Spinal Manipulations;Taping    PT Next Visit Plan  glute strengthening HEP, assess running on TM (if pt brings change of clothes)    PT Home Exercise Plan  Diaphragmatic Breathing, pelvic tilts, supine TA march, deadbug, abdominal prep, curl up on 9" ball    Consulted and Agree with Plan of Care  Patient       Patient will benefit from skilled therapeutic intervention in order to improve the following deficits and impairments:  Improper body mechanics, Pain, Postural dysfunction, Decreased activity tolerance, Decreased strength  Visit Diagnosis: Chronic right-sided low back pain without sciatica     Problem List Patient Active Problem List   Diagnosis Date Noted  . Family history of colon cancer 12/04/2017  . Uterine fibroid   . Umbilical hernia   . Menorrhagia   . Blood type Hardin Negus PT, DPT [phone] 02/14/2018, 12:38 PM  [place] MAIN [facility] SERVICES [address] [place], [place], [address] Phone: [phone]   Fax:  [phone]  Name: Jessica Murphy MRN: [id number] Date of Birth: 28-Feb-1976

## 2018-02-19 ENCOUNTER — Encounter: Payer: 59 | Admitting: Physical Therapy

## 2018-02-21 ENCOUNTER — Encounter: Payer: 59 | Admitting: Physical Therapy

## 2018-02-26 ENCOUNTER — Telehealth: Payer: Self-pay

## 2018-02-26 NOTE — Telephone Encounter (Signed)
PT called triage line stating she would like to speak to Brandon or her nurse about an issue. She has been having low right back pain. She recently had an annual exam in July and hasn't really thought about it until recently. Could this be related to the IUD she had placed about 1 year ago? CB# [phone]

## 2018-02-27 ENCOUNTER — Other Ambulatory Visit: Payer: Self-pay | Admitting: Certified Nurse Midwife

## 2018-02-27 DIAGNOSIS — Z30431 Encounter for routine checking of intrauterine contraceptive device: Secondary | ICD-10-CM

## 2018-02-27 NOTE — Telephone Encounter (Signed)
Thank you :)

## 2018-02-27 NOTE — Telephone Encounter (Signed)
Patient is schedule 02/28/18 at 1:30 and follow up at [facility] with Inez. Please place ultrasound order

## 2018-02-27 NOTE — Telephone Encounter (Signed)
Please call patient and schedule for an ultrasound to follow up on fibroids and IUD position and give her follow up appointment with me after ultrasound. Thanks. [employer]

## 2018-02-28 ENCOUNTER — Ambulatory Visit (INDEPENDENT_AMBULATORY_CARE_PROVIDER_SITE_OTHER): Payer: 59 | Admitting: Certified Nurse Midwife

## 2018-02-28 ENCOUNTER — Ambulatory Visit (INDEPENDENT_AMBULATORY_CARE_PROVIDER_SITE_OTHER): Payer: 59

## 2018-02-28 ENCOUNTER — Encounter: Payer: Self-pay | Admitting: Certified Nurse Midwife

## 2018-02-28 VITALS — BP 100/60 | HR 78 | Ht 64.0 in | Wt 131.5 lb

## 2018-02-28 DIAGNOSIS — D250 Submucous leiomyoma of uterus: Secondary | ICD-10-CM | POA: Diagnosis not present

## 2018-02-28 DIAGNOSIS — D252 Subserosal leiomyoma of uterus: Secondary | ICD-10-CM | POA: Diagnosis not present

## 2018-02-28 DIAGNOSIS — Z30431 Encounter for routine checking of intrauterine contraceptive device: Secondary | ICD-10-CM

## 2018-02-28 DIAGNOSIS — Z975 Presence of (intrauterine) contraceptive device: Secondary | ICD-10-CM | POA: Diagnosis not present

## 2018-02-28 DIAGNOSIS — G8929 Other chronic pain: Secondary | ICD-10-CM

## 2018-02-28 DIAGNOSIS — M545 Low back pain: Secondary | ICD-10-CM

## 2018-02-28 NOTE — Progress Notes (Signed)
  HPI: 42 year old G5 [id number] who has been having right lumbar sacral back pain since last year. The pain has worsened over the summer. The pain is constant but worsens with running and certain movements. She has been seen by orthopedics who referred her to PT. She has a history of fibroids/ menorrhagia and had a IUD inserted 03/2017. She was worried that her pain might be due to enlarging fibroids or that her IUD had perforated her uterus She reports that during the ultrasound, the pain was replicated when the transducer was pointed toward the right adnexal area. She denies dyspareunia.  Ultrasound demonstrates 2 fibroids: Fibroid 1: 1.7 x 1.6 x 1.6cm (right, subserosal, calcified) Fibroid 2: 5.5 x 4.9 x 5.1cm (left, submucosal, calcified). Each fibroid has grown minimally The Endometrium measures 9.4 mm. IUD in place. Right Ovary measures 4.7 x 2.6 x 2.2 cm with dominant follicle.  Left Ovary measures 3.5 x 3.4 x 2.1 cm with simple cyst measuring 2.8cm.  PMHx: She  has a past medical history of Blood type O-, Menorrhagia, Spontaneous abortion, Umbilical hernia, and Uterine fibroid. Also,  has a past surgical history that includes Cesarean section; Hysteroscopy (03/25/2009); and Wisdom tooth extraction., family history includes Alcohol abuse in her father; Alcohol abuse (age of onset: 63) in her cousin; Cancer (age of onset: 24) in her cousin; Cancer (age of onset: 26) in her maternal grandmother; Colitis in her mother; Congestive Heart Failure in her mother; Diabetes in her paternal grandfather and son; Heart disease in her maternal grandfather, maternal grandmother, and paternal grandfather; Hypertension in her mother; Lupus in her mother; Rheum arthritis in her mother; Sjogren's syndrome in her mother; Thyroid disease in her maternal aunt, maternal grandmother, and mother.,  reports that she has quit smoking. She has never used smokeless tobacco. She reports that she drinks alcohol. She reports that she  does not use drugs.  She has a current medication list which includes the following prescription(s): escitalopram, levonorgestrel, and meloxicam. Also, is allergic to guaifenesin.  ROS  Objective: BP 100/60   Pulse 78   Ht 5\' 4"  (1.626 m)   Wt 131 lb 8 oz (59.6 kg)   LMP  (LMP Unknown) Comment: spotting today  BMI 22.57 kg/m   Physical examination Constitutional NAD, Conversant  Skin No rashes, lesions or ulceration.   Extremities: Moves all appropriately.  Normal ROM for age. No lymphadenopathy.  Neuro: Grossly intact  Psych: Oriented to PPT.  Normal mood. Normal affect.  Back: small mobile lipoma over right SI area. No point tenderness  Assessment: Right lumbar sacral back pain Doubt that fibroids are cause of pain IUD is in place  P: Offered referral to PT for pelvic pain evaluation since her back pain was recreated by transvaginal transducer. Patient to consider In the meantime-can return to orthopedist for further options for treatment RTO prn.  Dalia Heading, CNM

## 2018-04-23 DIAGNOSIS — H5213 Myopia, bilateral: Secondary | ICD-10-CM | POA: Diagnosis not present

## 2018-04-25 DIAGNOSIS — M6289 Other specified disorders of muscle: Secondary | ICD-10-CM | POA: Diagnosis not present

## 2018-05-28 ENCOUNTER — Ambulatory Visit: Payer: 59 | Attending: Obstetrics and Gynecology | Admitting: Physical Therapy

## 2018-05-28 DIAGNOSIS — R293 Abnormal posture: Secondary | ICD-10-CM | POA: Insufficient documentation

## 2018-05-28 DIAGNOSIS — G8929 Other chronic pain: Secondary | ICD-10-CM | POA: Insufficient documentation

## 2018-05-28 DIAGNOSIS — M545 Low back pain: Secondary | ICD-10-CM | POA: Diagnosis not present

## 2018-05-28 DIAGNOSIS — M6208 Separation of muscle (nontraumatic), other site: Secondary | ICD-10-CM | POA: Insufficient documentation

## 2018-05-28 DIAGNOSIS — R278 Other lack of coordination: Secondary | ICD-10-CM | POA: Insufficient documentation

## 2018-05-28 DIAGNOSIS — M6281 Muscle weakness (generalized): Secondary | ICD-10-CM | POA: Diagnosis not present

## 2018-05-28 NOTE — Patient Instructions (Signed)
Work stretches: 6 directions of spine  Sidebend Arm swings  Minisquat-> chest lifts , pull hands behind to lift chest    Bear stretch at doorway behind each shoulder blade   __  Bring pic of profile of you at workstation  Feet on the ground,   weight bear on feet and sitting bones  NOT CROSS thighs     ____   Open book ( handout) 15 reps (am and pm)     Avoid straining pelvic floor, abdominal muscles , spine  Use log rolling technique instead of getting out of bed with your neck or the sit-up     Log rolling into and out of bed If getting out of bed on L side, 1) Bent knees, scoot hips/ shoulder to R   Raise L arm completely overhead, rolling onto armpit  Then lower bent knees to bed to get into complete side lying position  Then drop legs off bed, and push up onto L elbow/forearm, and use R hand to push onto the bend   _

## 2018-05-28 NOTE — Therapy (Signed)
[place] MAIN [facility] SERVICES [address] [place], [place], [address] Phone: [phone]   Fax:  [phone]  Physical Therapy Evaluation  Patient Details  Name: Jessica Murphy MRN: [id number] Date of Birth: Oct 25, 1975 Referring Provider (PT): Guadalupe Maple, MD    Encounter Date: 05/28/2018  PT End of Session - 05/28/18 1736    Visit Number  1    Number of Visits  12    PT Start Time  6962    PT Stop Time  1820    PT Time Calculation (min)  65 min       Past Medical History:  Diagnosis Date  . Blood type O-   . Menorrhagia   . Spontaneous abortion    END OF 2011/2012, 2017  . Umbilical hernia   . Uterine fibroid     Past Surgical History:  Procedure Laterality Date  . CESAREAN SECTION     X3  . HYSTEROSCOPY  03/25/2009   HYSTEROSCOPIC IUD REMOVAL  . WISDOM TOOTH EXTRACTION      There were no vitals filed for this visit.   Subjective Assessment - 05/28/18 1715    Subjective  1) Pt had LBP for one year and a few months, pt experienced at the R LBP suddenly. Pt had a Mirena placed around that time and two of her doctors have referred her to seek pelvic floor th erapy for this pain and that this pain is not related to the placement of her Mirena.  Pain typically is a 1-2/10 and sometimes pain can cause nausea ( 7-8/10) requiring her to take Motrin.  Getting into/ car of the car, running will trigger the pain. Pain is present but not worsened by stairs.  Pt used to run 4 mi every day or every other day for 4 years.  When she ran, there was an achy pain in the R the next day.  Denied radiating pain.  Pt attended spin class every other day for 9 years.  No pain with cycling. Pt does not stretch after running nor spinning.  Pt stopped running due to the pain. Pt stopped cycling due to her job schedule. Pt is getting a bike for house. Pt has had Xrays/ [place] at orthopedic surgeon. Pt attended PT for one session.  Denied changes to GI, changes  in bowel and bladder. Pt had a fall 2 years ago on her hands, leaning on R side.  Bowel movements: usually everyday but lately, everyother day due to not running.  Regular shaped stools without straining nor pushing.  Denied SUI, urge incontinence.  Hx of UTIs "once in a while".   Pt used body pump class, weights, rower but she has not working out lately. Pt did [employer] 1.5 years ago which caused a different pain in her R hip area and since she stopped going to [employer], pt's R hip pain disappeared.  Denied ankle, knee injuries. 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  Pt worked in this position for 10 years. Pt then held a position where she mostly sits for 2 years after this position and currently she sits but get up often.    2) Pelvic pain ( 6/10)   with tampon insertion. Pt feels " she has to completely relax to get it in smoothly."  No pelvic pain with  other activities.    3) umbilical hernia since the pregnancy of her last child.  Occasional pain ( occuring 1 x month)  even with a shift brushing against it, leaning against the counter     Pertinent History  Gynecological Hx of 3 C-sections over the same scar. Denied no complications with L & D.  Pt had 2 benign uterine  fiborids when pregnant with 1st child and has been monitored. They caused heavy bleeding which is why she is on the Mirena.  Pt had noticed a lump over her  R low back around the same time as the onset of her LBP. Pt f/u with orthopedic MD who told her that it was a lipoma.        Patient Stated Goals  Feel pain free and not take medications and to run          [facility] PT Assessment - 05/28/18 1745      Assessment   Medical Diagnosis  pelvic pain     Referring Provider (PT)  Guadalupe Maple, MD       Precautions   Precautions  None      Restrictions   Weight Bearing Restrictions  No      Balance Screen   Has the patient  fallen in the past 6 months  No      Observation/Other Assessments   Observations  R leg crossed over in sitting, standing with more weight on LLE, hips shifted to R       Coordination   Gross Motor Movements are Fluid and Coordinated  --   chest breathing. noted diaphragmatic excursion     AROM   AROM Assessment Site  --   +  LBP w/ R sideflexion, WFL/ no pain other directions    Thoracic - Right Side Bend  no pain post Tx       PROM   Overall PROM Comments  FADDIR L limited > R, + pain with extending L hip from [place] position       Strength   Overall Strength Comments  R hip flex 4+/5, L 5/5, knee flex/ext 5/5 B,  PF no UE support 5/5 R 2reps, L 5reps, L hip abd 4-/5,R hip 4-/5   ,  hip ext L 4-/5, R 3+/5      lumbopelvic pertubations w/ hip ext MMT      Palpation   Spinal mobility  tightness of iliocostalis B     SI assessment   R iliac crest higher in standing, supine: levelled ASIS nor rotation.        Palpation comment  pelvic floor without tensions. tenderness B, SIJ mobility slightly hypomobility on L  . Abdominal scar restrictions L > R                Objective measurements completed on examination: See above findings.    Pelvic Floor Special Questions - 05/28/18 1856    Diastasis Recti  4 fingers width along linea alba        [facility] Adult PT Treatment/Exercise - 05/28/18 1900      Neuro Re-ed    Neuro Re-ed Details   Cued for body mechanics for log rolling, proper sitting posture, explained the anatomy/ physiology related to Sx with visual images, POC       Manual Therapy   Manual therapy comments  STM/ MWM at iliocostalis B           PT Long Term Goals - 05/28/18 1735  PT LONG TERM GOAL #1   Title  Pt will report no abdominal pain near umbilical hernia across 1 month when shirt brushes the area nor with leaning over counter in order to resume PLOF     Time  8    Period  Weeks    Status  New    Target Date  07/23/18      PT LONG TERM GOAL  #2   Title  Pt will decrease her NIH-CPSI from 35% to < 17% in order to restore pelvic floor function    Time  12    Period  Weeks    Status  New    Target Date  08/20/18      PT LONG TERM GOAL #3   Title  Pt will decrease her [place] from 9% to < 4% in order to resturn to recreational activities such as running without LBP     Time  4    Period  Weeks    Status  New    Target Date  06/25/18      PT LONG TERM GOAL #4   Title  Pt will demo proper body mechanics with sitting, standing, log rolling off PT plinth without cues across 2 visits in order to demo less strain on abdominal/ pelvic floor/ spine     Time  4    Period  Weeks    Status  New    Target Date  06/25/18      PT LONG TERM GOAL #5   Title  Pt will demo decreased 4 fingers width to < 2 fingers width along linea alba  in order to improve intraabdominal pressure system for running     Time  8    Period  Weeks    Status  New    Target Date  07/23/18      Additional Long Term Goals   Additional Long Term Goals  Yes      PT LONG TERM GOAL #6   Title  Pt will demo no lumbopelvic perturbation with hip ext MMT in order to demo  increased deep core strength and stability to progress to dynamic balance for less injuries for running     Time  10    Period  Weeks    Status  New    Target Date  08/06/18      PT LONG TERM GOAL #7   Title  Pt will demo increased hip abduction  / extension strength B to 5/5 and increased reps on standing PF 4/5 Grade from 3-5 reps to > 15 reps in order to progress to running safely     Baseline  R hip flex 4+/5, L 5/5, knee flex/ext 5/5 B, PF no UE support 5/5 R 2reps, L 5reps, L hip abd 4-/5,R hip 4-/5   ,  hip ext L 4-/5, R 3+/5     Time  5    Period  Weeks    Status  New    Target Date  07/02/18      PT LONG TERM GOAL #8   Title  Pt will report no pelvic pain with inserting tampons in order to perform ADLs     Time  10    Period  Weeks    Status  New    Target Date  08/06/18      PT  LONG TERM GOAL  #9   TITLE  Pt will demo IND with flexibility routine and be compliant  with runnning and cycling along with cross training routine for strength training to minimize risk for injuries with her favorite forms of exercise     Time  12    Period  Weeks    Status  New    Target Date  08/20/18      PT LONG TERM GOAL  #10   TITLE  Pt will demo decreased abdominal scar restrictions in order to progress with deep core coordination in order to decrease pelvic pain     Time  5    Period  Weeks    Status  New    Target Date  07/02/18                     Plan - 05/28/18 1901    Clinical Impression Statement  Pt is a 43 yo female who reports of non-radiating R CLBP, pelvic pain, and abdominal pain 2/2 umbilical hernia. These deficits impact her ADLs and QOL. Pt 's clinical presentation with limited spinal mobility, diastasis recti, dyscoordination and strength of pelvic floor mm, weak hip weakness, abdominal scar restrictions L > R, and poor body mechanics which places strain on the abdominal/pelvic floor mm. These are deficits that indicate an ineffective intraabdominal pressure system associated with all of her Sx. Following Tx today, pt demo'd improved body mechanics to minimize straining abdominal and pelvic area. Pt also demo'd no R LBP with side flexion after manual Tx which addressed tight paraspinal mm. Plan to address diastasis recti at next session, progress with deep core strengthening at next session. Plan to perform internal pelvic floor assessment at upcoming visits.   Pt will benefit from coordination training and education on fitness, functional positions, and proper flexibility / cross-training program with running/ cycling  in order to gain a more effective intraabdominal pressure system to minimize Sx.  Pt was provided education on etiology of Sx with anatomy, physiology explanation with images along with the benefits of customized pelvic PT Tx based on pt's  medical conditions and musculoskeletal deficits.     Clinical Presentation  Evolving    Clinical Decision Making  Moderate    Rehab Potential  Excellent    PT Frequency  1x / week    PT Duration  4 weeks    PT Treatment/Interventions  Cryotherapy;Moist Heat;Ultrasound;Electrical Stimulation;Balance training;Neuromuscular re-education;Gait training;Therapeutic exercise;Therapeutic activities;Patient/family education;Manual techniques;Joint Manipulations;Spinal Manipulations;Taping    Consulted and Agree with Plan of Care  Patient       Patient will benefit from skilled therapeutic intervention in order to improve the following deficits and impairments:  Improper body mechanics, Pain, Postural dysfunction, Decreased activity tolerance, Decreased strength, Increased muscle spasms, Decreased scar mobility, Decreased mobility, Decreased range of motion, Decreased endurance, Hypomobility, Increased fascial restricitons  Visit Diagnosis: Diastasis recti  Muscle weakness (generalized)  Abnormal posture  Other lack of coordination     Problem List Patient Active Problem List   Diagnosis Date Noted  . Family history of colon cancer 12/04/2017  . Uterine fibroid   . Umbilical hernia   . Menorrhagia   . Blood type O-     Jerl Mina ,PT, DPT, E-RYT  05/28/2018, 7:08 PM  [place] MAIN [facility] SERVICES [address] [place], [place], [address] Phone: [phone]   Fax:  [phone]  Name: Jessica Murphy MRN: [id number] Date of Birth: [birth year]

## 2018-06-06 ENCOUNTER — Ambulatory Visit: Payer: 59 | Admitting: Physical Therapy

## 2018-06-06 DIAGNOSIS — M6208 Separation of muscle (nontraumatic), other site: Secondary | ICD-10-CM | POA: Diagnosis not present

## 2018-06-06 DIAGNOSIS — M545 Low back pain, unspecified: Secondary | ICD-10-CM

## 2018-06-06 DIAGNOSIS — R278 Other lack of coordination: Secondary | ICD-10-CM

## 2018-06-06 DIAGNOSIS — G8929 Other chronic pain: Secondary | ICD-10-CM | POA: Diagnosis not present

## 2018-06-06 DIAGNOSIS — M6281 Muscle weakness (generalized): Secondary | ICD-10-CM | POA: Diagnosis not present

## 2018-06-06 DIAGNOSIS — R293 Abnormal posture: Secondary | ICD-10-CM

## 2018-06-06 NOTE — Patient Instructions (Signed)
3 finger binder for higher laptop screen   Ergo sheet ( handout)  ______ Two exercises at work breaks every 1 hr     Minisquat: Scoot buttocks back slight, hinge like you are looking at your reflection on a pond  Knees behind toes,  Inhale to "smell flowers"  Exhale on the rise "like rocket"  Do not lock knees, have more weight across ballmounds of feet, toes relaxed   10 reps x 3 x day   __USE THUMB AT RIBS AND INDEX FINGER AT PELVIS ASIS point as markers to ensure less arching of low back   ______  Bear stretch at doorway for between shoulder blades ( mini squat postion ( watch for less low back arch)    __________  Deep core level 1 and 2 ( handout)    Ap  Finding a comfortable position when laying on your back  Laying on your back, lift hips up, then scoot tail under, lowering ribs / midback first, then the low back Pillow under knees

## 2018-06-07 NOTE — Therapy (Signed)
[place] MAIN [facility] SERVICES [address] [place], [place], [address] Phone: [phone]   Fax:  [phone]  Physical Therapy Treatment  Patient Details  Name: Jessica Murphy MRN: [id number] Date of Birth: 08/30/1975 Referring Provider (PT): Guadalupe Maple, MD    Encounter Date: 06/06/2018  PT End of Session - 06/07/18 0023    Visit Number  2    Number of Visits  12    PT Start Time  1700    PT Stop Time  3790    PT Time Calculation (min)  65 min    Activity Tolerance  Patient tolerated treatment well    Behavior During Therapy  [facility] for tasks assessed/performed       Past Medical History:  Diagnosis Date  . Blood type O-   . Menorrhagia   . Spontaneous abortion    END OF 2011/2012, 2017  . Umbilical hernia   . Uterine fibroid     Past Surgical History:  Procedure Laterality Date  . CESAREAN SECTION     X3  . HYSTEROSCOPY  03/25/2009   HYSTEROSCOPIC IUD REMOVAL  . WISDOM TOOTH EXTRACTION      There were no vitals filed for this visit.  Subjective Assessment - 06/06/18 1601    Subjective  Pt reported she has been doing her exercises.     Pertinent History  Gynecological Hx of 3 C-sections over the same scar. Denied no complications with L & D.  Pt had 2 benign uterine  fiborids when pregnant with 1st child and has been monitored. They caused heavy bleeding which is why she is on the Mirena.  Pt had noticed a lump over her  R low back around the same time as the onset of her LBP. Pt f/u with orthopedic MD who told her that it was a lipoma.        Patient Stated Goals  Feel pain free and not take medications and to run          [facility] PT Assessment - 06/07/18 0018      Palpation   Spinal mobility  significantly increased tensions along interspinals/ paraspinals, medial scapula  R > L                 Pelvic Floor Special Questions - 06/07/18 0019    Diastasis Recti  4 fingers width along linea alba          [facility] Adult PT Treatment/Exercise - 06/07/18 0019      Neuro Re-ed    Neuro Re-ed Details   cued for less abd straining with exhalation which caused a more downward movement on pelvic floor and straining of abdomen, cued for less anterior pushing of ab muscles, more coordinaiton with deep core, cued for less lumbo lordosis         Exercises   Exercises  --   see pt instructions for maintaining flexibility in thoracic     Manual Therapy   Manual therapy comments  STM/ MWM to decrease tightness at thoracic region to faciliate more rotation, diaphragmatic excursion, quadriped position / ab  fascial pulling with UE flexion/ trunk rotation         Kinesiotaping over upper/lower ab for approximation of rectus abdominus            PT Long Term Goals - 05/28/18 1735      PT LONG TERM GOAL #1   Title  Pt will report no abdominal pain near  umbilical hernia across 1 month when shirt brushes the area nor with leaning over counter in order to resume PLOF     Time  8    Period  Weeks    Status  New    Target Date  07/23/18      PT LONG TERM GOAL #2   Title  Pt will decrease her NIH-CPSI from 35% to < 17% in order to restore pelvic floor function    Time  12    Period  Weeks    Status  New    Target Date  08/20/18      PT LONG TERM GOAL #3   Title  Pt will decrease her [place] from 9% to < 4% in order to resturn to recreational activities such as running without LBP     Time  4    Period  Weeks    Status  New    Target Date  06/25/18      PT LONG TERM GOAL #4   Title  Pt will demo proper body mechanics with sitting, standing, log rolling off PT plinth without cues across 2 visits in order to demo less strain on abdominal/ pelvic floor/ spine     Time  4    Period  Weeks    Status  New    Target Date  06/25/18      PT LONG TERM GOAL #5   Title  Pt will demo decreased 4 fingers width to < 2 fingers width along linea alba  in order to improve intraabdominal pressure system  for running     Time  8    Period  Weeks    Status  New    Target Date  07/23/18      Additional Long Term Goals   Additional Long Term Goals  Yes      PT LONG TERM GOAL #6   Title  Pt will demo no lumbopelvic perturbation with hip ext MMT in order to demo  increased deep core strength and stability to progress to dynamic balance for less injuries for running     Time  10    Period  Weeks    Status  New    Target Date  08/06/18      PT LONG TERM GOAL #7   Title  Pt will demo increased hip abduction  / extension strength B to 5/5 and increased reps on standing PF 4/5 Grade from 3-5 reps to > 15 reps in order to progress to running safely     Baseline  R hip flex 4+/5, L 5/5, knee flex/ext 5/5 B, PF no UE support 5/5 R 2reps, L 5reps, L hip abd 4-/5,R hip 4-/5   ,  hip ext L 4-/5, R 3+/5     Time  5    Period  Weeks    Status  New    Target Date  07/02/18      PT LONG TERM GOAL #8   Title  Pt will report no pelvic pain with inserting tampons in order to perform ADLs     Time  10    Period  Weeks    Status  New    Target Date  08/06/18      PT LONG TERM GOAL  #9   TITLE  Pt will demo IND with flexibility routine and be compliant with runnning and cycling along with cross training routine for strength training to minimize risk for injuries  with her favorite forms of exercise     Time  7    Period  Weeks    Status  New    Target Date  08/20/18      PT LONG TERM GOAL  #10   TITLE  Pt will demo decreased abdominal scar restrictions in order to progress with deep core coordination in order to decrease pelvic pain     Time  5    Period  Weeks    Status  New    Target Date  07/02/18            Plan - 06/07/18 0016    Clinical Impression Statement Addressed diastasis recti today with manual Tx to promote thoracic rotation, thoracic expansion and depression and cues to decrease ab straining/ lumbar lordosis. Pt demo'd improved deep core coordination post Tx. Provided  stretches at work and education on ergonomics at work to minimize thoracic tightness. Pt continues to benefit from skilled PT.      Rehab Potential  Excellent    PT Frequency  1x / week    PT Duration  12 weeks    PT Treatment/Interventions  Cryotherapy;Moist Heat;Ultrasound;Electrical Stimulation;Balance training;Neuromuscular re-education;Gait training;Therapeutic exercise;Therapeutic activities;Patient/family education;Manual techniques;Joint Manipulations;Spinal Manipulations;Taping    Consulted and Agree with Plan of Care  Patient       Patient will benefit from skilled therapeutic intervention in order to improve the following deficits and impairments:  Improper body mechanics, Pain, Postural dysfunction, Decreased activity tolerance, Decreased strength, Increased muscle spasms, Decreased scar mobility, Decreased mobility, Decreased range of motion, Decreased endurance, Hypomobility, Increased fascial restricitons  Visit Diagnosis: Muscle weakness (generalized)  Diastasis recti  Abnormal posture  Other lack of coordination  Chronic right-sided low back pain without sciatica     Problem List Patient Active Problem List   Diagnosis Date Noted  . Family history of colon cancer 12/04/2017  . Uterine fibroid   . Umbilical hernia   . Menorrhagia   . Blood type O-     Jerl Mina ,PT, DPT, E-RYT  06/07/2018, 12:24 AM  [place] MAIN [facility] SERVICES [address] [place], [place], [address] Phone: [phone]   Fax:  [phone]  Name: Jessica Murphy MRN: [id number] Date of Birth: 1975-08-05

## 2018-06-10 ENCOUNTER — Ambulatory Visit: Payer: 59 | Admitting: Cardiovascular Disease

## 2018-06-11 ENCOUNTER — Ambulatory Visit: Payer: 59 | Admitting: General Surgery

## 2018-06-11 ENCOUNTER — Other Ambulatory Visit: Payer: Self-pay

## 2018-06-11 ENCOUNTER — Encounter: Payer: Self-pay | Admitting: General Surgery

## 2018-06-11 VITALS — BP 126/89 | HR 73 | Temp 98.1°F | Ht 64.0 in | Wt 134.0 lb

## 2018-06-11 DIAGNOSIS — K429 Umbilical hernia without obstruction or gangrene: Secondary | ICD-10-CM | POA: Diagnosis not present

## 2018-06-11 NOTE — Patient Instructions (Signed)
Umbilical Hernia, Adult  A hernia is a bulge of tissue that pushes through an opening between muscles. An umbilical hernia happens in the abdomen, near the belly button (umbilicus). The hernia may contain tissues from the small intestine, large intestine, or fatty tissue covering the intestines (omentum). Umbilical hernias in adults tend to get worse over time, and they require surgical treatment. There are several types of umbilical hernias. You may have:  A hernia located just above or below the umbilicus (indirect hernia). This is the most common type of umbilical hernia in adults.  A hernia that forms through an opening formed by the umbilicus (direct hernia).  A hernia that comes and goes (reducible hernia). A reducible hernia may be visible only when you strain, lift something heavy, or cough. This type of hernia can be pushed back into the abdomen (reduced).  A hernia that traps abdominal tissue inside the hernia (incarcerated hernia). This type of hernia cannot be reduced.  A hernia that cuts off blood flow to the tissues inside the hernia (strangulated hernia). The tissues can start to die if this happens. This type of hernia requires emergency treatment. What are the causes? An umbilical hernia happens when tissue inside the abdomen presses on a weak area of the abdominal muscles. What increases the risk? You may have a greater risk of this condition if you:  Are obese.  Have had several pregnancies.  Have a buildup of fluid inside your abdomen (ascites).  Have had surgery that weakens the abdominal muscles. What are the signs or symptoms? The main symptom of this condition is a painless bulge at or near the belly button. A reducible hernia may be visible only when you strain, lift something heavy, or cough. Other symptoms may include:  Dull pain.  A feeling of pressure. Symptoms of a strangulated hernia may include:  Pain that gets increasingly worse.  Nausea and  vomiting.  Pain when pressing on the hernia.  Skin over the hernia becoming red or purple.  Constipation.  Blood in the stool. How is this diagnosed? This condition may be diagnosed based on:  A physical exam. You may be asked to cough or strain while standing. These actions increase the pressure inside your abdomen and force the hernia through the opening in your muscles. Your health care provider may try to reduce the hernia by pressing on it.  Your symptoms and medical history. How is this treated? Surgery is the only treatment for an umbilical hernia. Surgery for a strangulated hernia is done as soon as possible. If you have a small hernia that is not incarcerated, you may need to lose weight before having surgery. Follow these instructions at home:  Lose weight, if told by your health care provider.  Do not try to push the hernia back in.  Watch your hernia for any changes in color or size. Tell your health care provider if any changes occur.  You may need to avoid activities that increase pressure on your hernia.  Do not lift anything that is heavier than 10 lb (4.5 kg) until your health care provider says that this is safe.  Take over-the-counter and prescription medicines only as told by your health care provider.  Keep all follow-up visits as told by your health care provider. This is important. Contact a health care provider if:  Your hernia gets larger.  Your hernia becomes painful. Get help right away if:  You develop sudden, severe pain near the area of your hernia.    You have pain as well as nausea or vomiting.  You have pain and the skin over your hernia changes color.  You develop a fever. This information is not intended to replace advice given to you by your health care provider. Make sure you discuss any questions you have with your health care provider. Document Released: 09/24/2015 Document Revised: 06/06/2017 Document Reviewed: 10/23/2016 Elsevier  Interactive Patient Education  2019 [employer]  

## 2018-06-11 NOTE — Progress Notes (Signed)
Patient ID: Jessica Murphy, female   DOB: [birth year], 43 y.o.   MRN: [id number]  Chief Complaint  Patient presents with  . Umbilical Jessica    HPI Jessica Murphy is a 43 y.o. female here today for a evaluation of a umbilical Jessica . Patient noticed this area for about 11 years after Jessica Murphy last child. Some pain with activity, no change in size. Jessica Murphy states Jessica Murphy has been having lower back pain, just starting to work with a physical therapist.  Jessica Murphy had 3 children, all boys.  The Jessica developed after the last Murphy.  No history of unusual nausea, vomiting or constipation.  The area is normally protuberant when standing with intermittent tenderness with direct pressure.  Sometimes Jessica Murphy because Jessica Murphy has not been doing any vigorous abdominal exercises in recent years.  The patient is aware of a very slight diastases at the inferior aspect of the upper abdomen above the level of the umbilicus.  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  Prior experience was in labor delivery.  HPI  Past Medical History:  Diagnosis Date  . Blood type O-   . Menorrhagia   . Spontaneous abortion    END OF 2011/2012, 2017  . Umbilical Jessica   . Uterine fibroid     Past Surgical History:  Procedure Laterality Date  . CESAREAN SECTION     X3  . HYSTEROSCOPY  03/25/2009   HYSTEROSCOPIC IUD REMOVAL  . WISDOM TOOTH EXTRACTION      Family History  Problem Relation Age of Onset  . Congestive Heart Failure Mother   . Hypertension Mother   . Thyroid disease Mother        HYPO  . Lupus Mother        ERYTHEMATOSUS  . Rheum arthritis Mother   . Sjogren's syndrome Mother   . Colitis Mother        collagenous  . Alcohol abuse Father        DECEASED 08/09/09  . Cancer Maternal Grandmother 58       COLON  . Heart disease Maternal Grandmother   . Thyroid disease Maternal Grandmother   . Heart disease Maternal Grandfather   . Diabetes Paternal Grandfather        TYPE 2  . Heart disease  Paternal Grandfather   . Cancer Cousin 44       BREAST  . Alcohol abuse Cousin 43  . Diabetes Son        TYPE 1  . Thyroid disease Maternal Aunt     Social History Social History   Tobacco Use  . Smoking status: Former Research scientist (life sciences)  . Smokeless tobacco: Never Used  . Tobacco comment: QUIT 2002  Substance Use Topics  . Alcohol use: Yes    Comment: occ  . Drug use: No    Allergies  Allergen Reactions  . Guaifenesin Palpitations    Current Outpatient Medications  Medication Sig Dispense Refill  . escitalopram (LEXAPRO) 5 MG tablet Take 5 mg by mouth daily.    Marland Kitchen levonorgestrel (MIRENA) 20 MCG/24HR IUD 1 Intra Uterine Device by Intrauterine route continuous.    . meloxicam (MOBIC) 15 MG tablet Take 1 tablet by mouth as needed.  2   No current facility-administered medications for this visit.     Review of Systems Review of Systems  Constitutional: Negative.   Respiratory: Negative.   Cardiovascular: Negative.     Blood pressure 126/89, pulse 73, temperature 98.1 F (  36.7 C), temperature source Skin, height 5\' 4"  (1.626 m), weight 134 lb (60.8 kg), SpO2 98 %.  Physical Exam Physical Exam Constitutional:      Appearance: Jessica Murphy is well-developed.  Eyes:     General: No scleral icterus.    Conjunctiva/sclera: Conjunctivae normal.  Neck:     Musculoskeletal: Neck supple.  Cardiovascular:     Rate and Rhythm: Normal rate and regular rhythm.     Heart sounds: Normal heart sounds.  Pulmonary:     Effort: Pulmonary effort is normal.     Breath sounds: Normal breath sounds.  Abdominal:     General: Bowel sounds are normal.     Jessica: A Jessica is present. Jessica is present in the umbilical area.    Lymphadenopathy:     Cervical: No cervical adenopathy.  Skin:    General: Skin is warm and dry.  Neurological:     Mental Status: Jessica Murphy is alert and oriented to person, place, and time.     Data Reviewed October 2019 GYN evaluation with Dalia Heading,  CNM  Assessment    Small, symptomatic umbilical Jessica.    Plan  Indications for elective repair were reviewed.  At this time, I do not see any potential benefit from prosthetic mesh placement.  Activity restrictions after surgery including avoiding driving until pain-free and lifting over 10 pounds were reviewed.  Jessica precautions and incarceration were discussed with the patient. If they develop symptoms of an incarcerated Jessica, they were encouraged to seek prompt medical attention.   HPI, Physical Exam, Assessment and Plan have been scribed under the direction and in the presence of Hervey Ard, MD.  Gaspar Cola, CMA'  I have completed the exam and reviewed the above documentation for accuracy and completeness.  I agree with the above.  [employer] has been used and any errors in dictation or transcription are unintentional.  The patient will contact the office for scheduling sometime after April 1 to meet Jessica Murphy work and home needs.  A preoperative visit will not be required.  Hervey Ard, M.D., F.A.C.S.  Forest Gleason Seon Gaertner 06/11/2018, 10:01 AM

## 2018-06-13 ENCOUNTER — Ambulatory Visit: Payer: 59 | Admitting: Physical Therapy

## 2018-06-17 ENCOUNTER — Ambulatory Visit: Payer: 59 | Attending: Obstetrics and Gynecology | Admitting: Physical Therapy

## 2018-06-17 ENCOUNTER — Telehealth: Payer: Self-pay | Admitting: General Surgery

## 2018-06-17 DIAGNOSIS — M6281 Muscle weakness (generalized): Secondary | ICD-10-CM | POA: Diagnosis not present

## 2018-06-17 DIAGNOSIS — R293 Abnormal posture: Secondary | ICD-10-CM | POA: Insufficient documentation

## 2018-06-17 DIAGNOSIS — G8929 Other chronic pain: Secondary | ICD-10-CM | POA: Insufficient documentation

## 2018-06-17 DIAGNOSIS — M545 Low back pain, unspecified: Secondary | ICD-10-CM

## 2018-06-17 DIAGNOSIS — R278 Other lack of coordination: Secondary | ICD-10-CM | POA: Insufficient documentation

## 2018-06-17 DIAGNOSIS — M6208 Separation of muscle (nontraumatic), other site: Secondary | ICD-10-CM | POA: Diagnosis not present

## 2018-06-17 NOTE — Telephone Encounter (Signed)
Patient contacted today and surgery to be scheduled for 08-09-18 at [facility] with Dr. Bary Castilla per patient's request. History and physical will be updated the morning of procedure. No pre-op visit required per Dr. Bary Castilla.  The patient is aware she will be contacted by the [facility] to complete a phone interview about one week prior to surgery date.  Surgery instructions were reviewed with the patient by phone today.   The patient is aware to call the office should she have further questions.

## 2018-06-17 NOTE — Therapy (Signed)
[place] [facility] SERVICES [address] [place], [place], [address] Phone: [phone]   Fax:  [phone]  Physical Therapy Treatment  Patient Details  Name: Jessica Murphy MRN: [id number] Date of Birth: [birth year] Referring Provider (PT): Guadalupe Maple, MD    Encounter Date: 06/17/2018  PT End of Session - 06/17/18 1705    Visit Number  3    Number of Visits  12    PT Start Time  2706    PT Stop Time  1700    PT Time Calculation (min)  55 min    Activity Tolerance  Patient tolerated treatment well    Behavior During Therapy  [facility] for tasks assessed/performed       Past Medical History:  Diagnosis Date  . Blood type O-   . Menorrhagia   . Spontaneous abortion    END OF 2011/2012, 2017  . Umbilical hernia   . Uterine fibroid     Past Surgical History:  Procedure Laterality Date  . CESAREAN SECTION     X3  . HYSTEROSCOPY  03/25/2009   HYSTEROSCOPIC IUD REMOVAL  . WISDOM TOOTH EXTRACTION      There were no vitals filed for this visit.  Subjective Assessment - 06/17/18 1607    Subjective  Pt had no complaints  from last session. Pt is not sure if she is doing the deep core exercise right. Her LBP is still present     Pertinent History  Gynecological Hx of 3 C-sections over the same scar. Denied no complications with L & D.  Pt had 2 benign uterine  fiborids when pregnant with 1st child and has been monitored. They caused heavy bleeding which is why she is on the Mirena.  Pt had noticed a lump over her  R low back around the same time as the onset of her LBP. Pt f/u with orthopedic MD who told her that it was a lipoma.        Patient Stated Goals  Feel pain free and not take medications and to run                     Pelvic Floor Special Questions - 06/17/18 1703    Diastasis Recti  1 fingers width with head lift     Pelvic Floor Internal Exam  pt consented verbally without contraindications     Exam Type   Vaginal    Palpation  increased tightness at R coccygeus, obt int, piriformis        [facility] Adult PT Treatment/Exercise - 06/17/18 1701      Neuro Re-ed    Neuro Re-ed Details   excessive cues for less ab straining/ downward  .  educated with verbal, tactile, verbal cues for sequential movement of pelvic floor in relation to ab      Manual Therapy   Internal Pelvic Floor  MWM/ STM  to release tightness at R coccygeus, obt int, piriformis ( decreased post Tx)                    PT Long Term Goals - 05/28/18 1735      PT LONG TERM GOAL #1   Title  Pt will report no abdominal pain near umbilical hernia across 1 month when shirt brushes the area nor with leaning over counter in order to resume PLOF     Time  8    Period  Weeks  Status  New    Target Date  07/23/18      PT LONG TERM GOAL #2   Title  Pt will decrease her NIH-CPSI from 35% to < 17% in order to restore pelvic floor function    Time  12    Period  Weeks    Status  New    Target Date  08/20/18      PT LONG TERM GOAL #3   Title  Pt will decrease her [place] from 9% to < 4% in order to resturn to recreational activities such as running without LBP     Time  4    Period  Weeks    Status  New    Target Date  06/25/18      PT LONG TERM GOAL #4   Title  Pt will demo proper body mechanics with sitting, standing, log rolling off PT plinth without cues across 2 visits in order to demo less strain on abdominal/ pelvic floor/ spine     Time  4    Period  Weeks    Status  New    Target Date  06/25/18      PT LONG TERM GOAL #5   Title  Pt will demo decreased 4 fingers width to < 2 fingers width along linea alba  in order to improve intraabdominal pressure system for running     Time  8    Period  Weeks    Status  New    Target Date  07/23/18      Additional Long Term Goals   Additional Long Term Goals  Yes      PT LONG TERM GOAL #6   Title  Pt will demo no lumbopelvic perturbation with hip ext MMT in order to  demo  increased deep core strength and stability to progress to dynamic balance for less injuries for running     Time  10    Period  Weeks    Status  New    Target Date  08/06/18      PT LONG TERM GOAL #7   Title  Pt will demo increased hip abduction  / extension strength B to 5/5 and increased reps on standing PF 4/5 Grade from 3-5 reps to > 15 reps in order to progress to running safely     Baseline  R hip flex 4+/5, L 5/5, knee flex/ext 5/5 B, PF no UE support 5/5 R 2reps, L 5reps, L hip abd 4-/5,R hip 4-/5   ,  hip ext L 4-/5, R 3+/5     Time  5    Period  Weeks    Status  New    Target Date  07/02/18      PT LONG TERM GOAL #8   Title  Pt will report no pelvic pain with inserting tampons in order to perform ADLs     Time  10    Period  Weeks    Status  New    Target Date  08/06/18      PT LONG TERM GOAL  #9   TITLE  Pt will demo IND with flexibility routine and be compliant with runnning and cycling along with cross training routine for strength training to minimize risk for injuries with her favorite forms of exercise     Time  12    Period  Weeks    Status  New    Target Date  08/20/18  PT LONG TERM GOAL  #10   TITLE  Pt will demo decreased abdominal scar restrictions in order to progress with deep core coordination in order to decrease pelvic pain     Time  5    Period  Weeks    Status  New    Target Date  07/02/18            Plan - 06/17/18 1706    Clinical Impression Statement  Diastasis recti is resolving with 1 fingers width instead of 4 fingers width. Required excessive cues for less ab straining with deep core coordination. Internal pelvic floor assessment showed signfiicantly tight R pelvic floor comapred to L. Pt demo'd decreased tightness post Tx and elicited proper coordination of pelvic floor with ab , diaphragm  after neuromuscular re-education training.   Changed one set of deep core strengthening in seated position which pt demo'd properly in order  to increase compliance with HEP into work day. Added stretches to lengthen pelvic floor on R.  Wrote a letter for pt to present to manager re: wearing shoes with proper sole support to otpimize better outcomes.  Pt continues to benefit from skilled PT.     Rehab Potential  Excellent    PT Frequency  1x / week    PT Duration  12 weeks    PT Treatment/Interventions  Cryotherapy;Moist Heat;Ultrasound;Electrical Stimulation;Balance training;Neuromuscular re-education;Gait training;Therapeutic exercise;Therapeutic activities;Patient/family education;Manual techniques;Joint Manipulations;Spinal Manipulations;Taping    Consulted and Agree with Plan of Care  Patient       Patient will benefit from skilled therapeutic intervention in order to improve the following deficits and impairments:  Improper body mechanics, Pain, Postural dysfunction, Decreased activity tolerance, Decreased strength, Increased muscle spasms, Decreased scar mobility, Decreased mobility, Decreased range of motion, Decreased endurance, Hypomobility, Increased fascial restricitons  Visit Diagnosis: Muscle weakness (generalized)  Diastasis recti  Abnormal posture  Other lack of coordination  Chronic right-sided low back pain without sciatica     Problem List Patient Active Problem List   Diagnosis Date Noted  . Family history of colon cancer 12/04/2017  . Uterine fibroid   . Umbilical hernia   . Menorrhagia   . Blood type O-     Jerl Mina ,PT, DPT, E-RYT  06/17/2018, 5:10 PM  [place] [facility] SERVICES [address] [place], [place], [address] Phone: [phone]   Fax:  [phone]  Name: TAYTE CHILDERS MRN: [id number] Date of Birth: 04-04-1976

## 2018-06-17 NOTE — Telephone Encounter (Signed)
Patient has called back. All information was discussed about physician estimate.   Patient would like to schedule her surgery with Dr Bary Castilla. She has requested the date of surgery 08/09/18. She is also asking if Dr Bary Castilla could update her History and Physical the day of surgery instead of her having another office visit, in which she would be responsible for another 50.00 co-pay.  Please call patient back at [phone].

## 2018-06-17 NOTE — Patient Instructions (Signed)
   Two stretches for this week ( pelvic floor)  ( morning and eve)   Frog stretch: laying on belly with pillow under hips, knees bent, inhale do nothing, exhale let ankles fall apart   10 -15 reps    Stretch for pelvic floor   "v heels slide away and then back toward buttocks and then rock knee to slight ,  slide heel along at 11 o clock away from buttocks   10 reps   ___   Practice proper pelvic floor coordination with Deep croe level 1 and 2    Inhale: expand pelvic floor muscles Exhale" "j" scoop, allow pelvic floor to close, lift first before belly sinks   ( not "draw abdominal muscle to spine" or strain with abdominal muscles")    ____   Deep core level 1and 2 ( midmorning seated) and again evening

## 2018-06-17 NOTE — Telephone Encounter (Signed)
Patient has requested an estimate for outpatient surgery with Dr Beverley Fiedler provided was [phone]. I have called [facility] and spoke with Salome.   Patient benefits: Co-pay: 250.00 Deductible: 300.00 Max OOP: 7,900.00  After patient meets her deductible, patient is responsible for 20% and the plan will pay 80%.  Estimate is 575.00 for provider services.   No authorization is required for CPT: 79444. Per Salome ref# [id number].  I called patient to discuss the above. No answer. I have left a message for patient to call me back. I will follow up.

## 2018-06-18 ENCOUNTER — Other Ambulatory Visit: Payer: Self-pay | Admitting: General Surgery

## 2018-06-18 DIAGNOSIS — K429 Umbilical hernia without obstruction or gangrene: Secondary | ICD-10-CM

## 2018-06-24 ENCOUNTER — Ambulatory Visit: Payer: 59 | Admitting: Physical Therapy

## 2018-06-26 ENCOUNTER — Telehealth: Payer: Self-pay | Admitting: General Surgery

## 2018-06-26 NOTE — Telephone Encounter (Signed)
Hysterectomy could be completed at the time of umbilical hernia repair, but the GYN service would need to determine she is a candidate for hysterectomy.  She should speak with Dalia Heading, CNM at [facility] and get evaluation by the physicians to determine if she would be a candidate.  Coordination may take extra time and result in a change in operative date, but would certainly be worthwhile if that is an appropriate procedure at this time.

## 2018-06-26 NOTE — Telephone Encounter (Signed)
She was asking if a hysterectomy could be done at the same time as her hernia or not? She has a history of bleeding and had a Mirena placed and is going to PT as well to see if that would help.

## 2018-06-26 NOTE — Telephone Encounter (Signed)
Patient is calling and has some questions about somethings and asked if someone would give her a call. Please call patient and advise.

## 2018-06-27 ENCOUNTER — Telehealth: Payer: Self-pay

## 2018-06-27 ENCOUNTER — Other Ambulatory Visit: Payer: Self-pay | Admitting: Obstetrics and Gynecology

## 2018-06-27 DIAGNOSIS — D250 Submucous leiomyoma of uterus: Secondary | ICD-10-CM

## 2018-06-27 DIAGNOSIS — D252 Subserosal leiomyoma of uterus: Principal | ICD-10-CM

## 2018-06-27 NOTE — Telephone Encounter (Signed)
Note to Denyse Dago at [facility].

## 2018-06-27 NOTE — Telephone Encounter (Signed)
Pt calling. Last year had heavy bleeding; iud inserted in [place]. Ever since has had back pain and d/c.  Has hernia repair scheduled with Dr. Bary Castilla on 08/09/18.  Is [facility] or PH willing to do hyst at same time as hernia repair c Dr. Bary Castilla.  Pt states Dr. Bary Castilla is okay with it.  [phone]

## 2018-06-27 NOTE — Telephone Encounter (Signed)
I notified the patient and she states she will contact Dalia Heading CNM to see about getting this coordinated, if she is a candidate for hysterectomy.

## 2018-07-01 ENCOUNTER — Ambulatory Visit: Payer: 59 | Admitting: Physical Therapy

## 2018-07-02 NOTE — Telephone Encounter (Signed)
Has been sent.

## 2018-07-03 NOTE — Telephone Encounter (Signed)
Appointment with Dr. Georgianne Fick at [facility] on 07-05-18 at 2:30 pm.

## 2018-07-05 ENCOUNTER — Ambulatory Visit (INDEPENDENT_AMBULATORY_CARE_PROVIDER_SITE_OTHER): Payer: 59

## 2018-07-05 ENCOUNTER — Telehealth: Payer: Self-pay | Admitting: Obstetrics and Gynecology

## 2018-07-05 ENCOUNTER — Telehealth: Payer: Self-pay

## 2018-07-05 ENCOUNTER — Encounter: Payer: Self-pay | Admitting: Obstetrics and Gynecology

## 2018-07-05 ENCOUNTER — Ambulatory Visit (INDEPENDENT_AMBULATORY_CARE_PROVIDER_SITE_OTHER): Payer: 59 | Admitting: Obstetrics and Gynecology

## 2018-07-05 VITALS — BP 116/88 | HR 85 | Wt 137.0 lb

## 2018-07-05 DIAGNOSIS — D250 Submucous leiomyoma of uterus: Secondary | ICD-10-CM

## 2018-07-05 DIAGNOSIS — R102 Pelvic and perineal pain: Secondary | ICD-10-CM | POA: Diagnosis not present

## 2018-07-05 DIAGNOSIS — N83202 Unspecified ovarian cyst, left side: Secondary | ICD-10-CM

## 2018-07-05 DIAGNOSIS — D252 Subserosal leiomyoma of uterus: Secondary | ICD-10-CM

## 2018-07-05 DIAGNOSIS — D259 Leiomyoma of uterus, unspecified: Secondary | ICD-10-CM

## 2018-07-05 NOTE — Telephone Encounter (Signed)
-----   Message from Malachy Mood, MD sent at 07/02/2018  3:58 PM EST ----- Regarding: Surgery Surgery Date: 08/09/2018  LOS: observation  Surgery Booking Request Patient Full Name: Jessica Murphy MRN: [id number]  DOB: 1975/07/28  Surgeon: Malachy Mood, MD  Requested Surgery Date and Time: Combined case with general surgery Primary Diagnosis and Code: Uterine fibroids Secondary Diagnosis and Code: Abnormal uterine bleeding Surgical Procedure: TLH/BS and cystoscopy L&D Notification:N/A Admission Status: observation Length of Surgery: 3hrs Special Case Needs: none H&P:  (date) Phone Interview or Office Pre-Admit: pre-admit Interpreter: No Language: English Medical Clearance: No Special Scheduling Instructions: combined case with general surgery

## 2018-07-05 NOTE — Progress Notes (Signed)
Gynecology Ultrasound Follow Up  Chief Complaint:  Chief Complaint  Patient presents with  . Follow-up    GYN Ultrasound/ discuss surgery     History of Present Illness: Patient is a 43 y.o. female who presents today for ultrasound evaluation of flank pain, uterine fibroids .  Ultrasound demonstrates the following findgins Adnexa: left ovary, with simple cyst now slightly larger at 4cm, imaged as early as 08/18/16 Uterus: 3 fibroids larges IM/SM 5cm with endometrial stripe without focal abnormalities, IUD in proper location deployment Additional: no free fluid  Review of Systems: Review of Systems  Constitutional: Negative.   Gastrointestinal: Positive for abdominal pain.  Genitourinary: Negative.     Past Medical History:  Past Medical History:  Diagnosis Date  . Blood type O-   . Menorrhagia   . Spontaneous abortion    END OF 2011/2012, 2017  . Umbilical hernia   . Uterine fibroid     Past Surgical History:  Past Surgical History:  Procedure Laterality Date  . CESAREAN SECTION     X3  . HYSTEROSCOPY  03/25/2009   HYSTEROSCOPIC IUD REMOVAL  . WISDOM TOOTH EXTRACTION      Gynecologic History:  No LMP recorded. (Menstrual status: IUD). Contraception: IUD Last Pap: 12/04/2017. Results were: .NIL HPV negative  Endometrial biopsy 12/29/2016 negative for hyperplasia or malignancy   Family History:  Family History  Problem Relation Age of Onset  . Congestive Heart Failure Mother   . Hypertension Mother   . Thyroid disease Mother        HYPO  . Lupus Mother        ERYTHEMATOSUS  . Rheum arthritis Mother   . Sjogren's syndrome Mother   . Colitis Mother        collagenous  . Alcohol abuse Father        DECEASED 2009-08-18  . Cancer Maternal Grandmother 58       COLON  . Heart disease Maternal Grandmother   . Thyroid disease Maternal Grandmother   . Heart disease Maternal Grandfather   . Diabetes Paternal Grandfather        TYPE 2  . Heart disease Paternal  Grandfather   . Cancer Cousin 44       BREAST  . Alcohol abuse Cousin 43  . Diabetes Son        TYPE 1  . Thyroid disease Maternal Aunt     Social History:  Social History   Socioeconomic History  . Marital status: Married    Spouse name: Not on file  . Number of children: 3  . Years of education: 62  . Highest education level: Not on file  Occupational History  . Occupation: NURSE    Comment: [employer]  . Financial resource strain: Not on file  . Food insecurity:    Worry: Not on file    Inability: Not on file  . Transportation needs:    Medical: Not on file    Non-medical: Not on file  Tobacco Use  . Smoking status: Former Research scientist (life sciences)  . Smokeless tobacco: Never Used  . Tobacco comment: QUIT 2002  Substance and Sexual Activity  . Alcohol use: Yes    Comment: occ  . Drug use: No  . Sexual activity: Yes    Partners: Male    Birth control/protection: I.U.D.  Lifestyle  . Physical activity:    Days per week: Not on file    Minutes per session: Not on  file  . Stress: Not on file  Relationships  . Social connections:    Talks on phone: Not on file    Gets together: Not on file    Attends religious service: Not on file    Active member of club or organization: Not on file    Attends meetings of clubs or organizations: Not on file    Relationship status: Not on file  . Intimate partner violence:    Fear of current or ex partner: Not on file    Emotionally abused: Not on file    Physically abused: Not on file    Forced sexual activity: Not on file  Other Topics Concern  . Not on file  Social History Narrative  . Not on file    Allergies:  Allergies  Allergen Reactions  . Guaifenesin Palpitations    Medications: Prior to Admission medications   Medication Sig Start Date End Date Taking? Authorizing Provider  escitalopram (LEXAPRO) 5 MG tablet Take 5 mg by mouth daily.   Yes [provider]  levonorgestrel (MIRENA) 20  MCG/24HR IUD 1 Intra Uterine Device by Intrauterine route continuous.   Yes [provider]    Physical Exam Vitals: Blood pressure 116/88, pulse 85, weight 137 lb (62.1 kg).  General: NAD HEENT: normocephalic, anicteric Pulmonary: No increased work of breathing Extremities: no edema, erythema, or tenderness Neurologic: Grossly intact, normal gait Psychiatric: mood appropriate, affect full  US Pelvis Transvanginal Non-ob (tv Only)  Result Date: 07/05/2018 Patient Name: Jessica Murphy DOB: 11-08-1975 MRN: [id number] ULTRASOUND REPORT Location: [facility] Date of Service: 07/05/2018 Indications: Fibroids Findings: The uterus is anteverted and measures 11.3 x 8.3 x 6.5cm. Echo texture is homogenous with evidence of focal masses. Within the uterus are multiple suspected fibroids measuring: Fibroid 1:  1.3 x 1.3 x 1.3cm  SS Fibroid 2:  5.2 x 4.7 x 4.3cm  IM vs SM Fibroid 3:  1.5 x 1.2 x 1.0cm  SS The Endometrium measures 6.6 mm. IUD in place. Right Ovary measures 4.2 x 1.9 x 1.7 cm. It is normal in appearance. Left Ovary measures 4.8 x 3.4 x 3.2 cm with simple cyst measuring 4.0 x 3.0 x 2.9cm (slightly larger than prior). Survey of the adnexa demonstrates no adnexal masses. There is no free fluid in the cul de sac. Impression: 1. Three calcified fibroids. 2. Left simple ovarian cyst. Recommendations: 1.Clinical correlation with the patient's History and Physical Exam. Jessica Murphy, Jessica Murphy Images reviewed.  Stable fibroids from prior exam, IUD in proper position.  Simple cyst left ovary slightly larger then on prior studies. Malachy Mood, MD, [facility], [facility]    Assessment: 43 y.o. [phone] with pelvic pain, uterine fibroids Plan: Problem List Items Addressed This Visit      Genitourinary   Uterine fibroid - Primary    Other Visit Diagnoses    Pelvic pain          1) AUB-L and dysmenorrhea managed with Mirena IUD (03/2017 at [facility]).  She has  noted right sided back flank and back pain.  I can not exclude that there might be some mass effect from her fibroids but her dominant fibroid as well as the simple adnexal cyst are both on the left.  She also has a history of two prior cesarean sections.  Patient opts for definitive surgical management via hysterectomy. The risks of surgery were discussed in detail with the patient including but not limited to: bleeding  which may require transfusion or reoperation; infection which may require antibiotics; injury to bowel, bladder, ureters or other surrounding organs (With a literature reported rate of urinary tract injury of 1% quoted); need for additional procedures including laparotomy; thromboembolic phenomenon, incisional problems and other postoperative/anesthesia complications.  Patient was also advised that recovery procedure generally involves an overnight stay; and the  expected recovery time after a hysterectomy being in the range of 6-8 weeks.  Likelihood of success in alleviating the patient's symptoms was discussed.  While definitive in regards to issues with menstural bleeding, pelvic pain if present preoperatively may continue and in fact worsen postoperatively.  She is aware that the procedure will render her unable to pursue childbearing in the future.    - Post for TLH, BS, left ovarian cystectomy, cystoscopy combined case with general surgery   Malachy Mood, MD, [place], [place]

## 2018-07-05 NOTE — Telephone Encounter (Signed)
Spoke with Jessica Murphy at [facility] and Dr Georgianne Fick can do her hysterectomy surgery on 08/09/18 with Dr Bary Castilla as his assist. This is fine with Dr Bary Castilla. They will contact the patient to notify her.

## 2018-07-05 NOTE — Telephone Encounter (Signed)
Patient is aware of H&P at [facility] on 07/31/18 @ 8:10am w/ Dr. Georgianne Fick, Pre-admit Testing afterwards, and OR on 08/09/18, combined case w/ Dr. Bary Castilla. Patient is aware she may receive calls from the [place] and [facility].

## 2018-07-11 ENCOUNTER — Telehealth: Payer: Self-pay

## 2018-07-11 ENCOUNTER — Encounter: Payer: 59 | Admitting: Physical Therapy

## 2018-07-11 NOTE — Telephone Encounter (Signed)
FMLA/DISABILITY form for [employer] filled out, signature obtained, and given to KT.

## 2018-07-15 ENCOUNTER — Encounter: Payer: 59 | Admitting: Physical Therapy

## 2018-07-22 ENCOUNTER — Encounter: Payer: 59 | Admitting: Physical Therapy

## 2018-07-26 ENCOUNTER — Other Ambulatory Visit: Payer: Commercial Managed Care - PPO

## 2018-07-29 ENCOUNTER — Encounter: Payer: 59 | Admitting: Physical Therapy

## 2018-07-31 ENCOUNTER — Encounter: Payer: 59 | Admitting: Obstetrics and Gynecology

## 2018-07-31 ENCOUNTER — Inpatient Hospital Stay: Admission: RE | Admit: 2018-07-31 | Payer: 59 | Source: Ambulatory Visit

## 2018-08-05 ENCOUNTER — Encounter: Payer: 59 | Admitting: Physical Therapy

## 2018-08-07 ENCOUNTER — Telehealth: Payer: 59 | Admitting: Family

## 2018-08-07 DIAGNOSIS — R05 Cough: Secondary | ICD-10-CM

## 2018-08-07 DIAGNOSIS — R059 Cough, unspecified: Secondary | ICD-10-CM

## 2018-08-07 DIAGNOSIS — Z7189 Other specified counseling: Secondary | ICD-10-CM | POA: Diagnosis not present

## 2018-08-07 MED ORDER — ALBUTEROL SULFATE HFA 108 (90 BASE) MCG/ACT IN AERS: 2.0000 | INHALATION_SPRAY | Freq: Four times a day (QID) | RESPIRATORY_TRACT | 0 refills | Status: DC | PRN

## 2018-08-07 MED ORDER — PROMETHAZINE-DM 6.25-15 MG/5ML PO SYRP: 5.0000 mL | ORAL_SOLUTION | Freq: Three times a day (TID) | ORAL | 0 refills | Status: DC | PRN

## 2018-08-07 NOTE — Progress Notes (Signed)
E-Visit for Corona Virus Screening  Based on your current symptoms, you may very well have the virus, however your symptoms are mild. Currently, not all patients are being tested. If the symptoms are mild and there is not a known exposure, performing the test is not indicated.  Approximately 5 minutes was spent documenting and reviewing patient's chart.   Coronavirus disease 2019 (COVID-19) is a respiratory illness that can spread from person to person. The virus that causes COVID-19 is a new virus that was first identified in the country of [place] but is now found in multiple other countries and has spread to the [place].  Symptoms associated with the virus are mild to severe fever, cough, and shortness of breath. There is currently no vaccine to protect against COVID-19, and there is no specific antiviral treatment for the virus.   To be considered HIGH RISK for Coronavirus (COVID-19), you have to meet the following criteria:  . Traveled to [place], [place], [place], [place] or [place]; or in the [place] to [place], [place], [place], or [place]; and have fever, cough, and shortness of breath within the last 2 weeks of travel OR  . Been in close contact with a person diagnosed with COVID-19 within the last 2 weeks and have fever, cough, and shortness of breath  . IF YOU DO NOT MEET THESE CRITERIA, YOU ARE CONSIDERED LOW RISK FOR COVID-19.   It is vitally important that if you feel that you have an infection such as this virus or any other virus that you stay home and away from places where you may spread it to others.  You should self-quarantine for 14 days if you have symptoms that could potentially be coronavirus and avoid contact with people age 36 and older.   You can use medication such as A prescription inhaler called Albuterol MDI 90 mcg /actuation 2 puffs every 4 hours as needed for shortness of breath, wheezing, cough and A prescription cough medication called Phenergan DM  6.25 mg/15 mg. You make take one teaspoon / 5 ml every 4-6 hours as needed for cough.  You may also take acetaminophen (Tylenol) as needed for fever.   Reduce your risk of any infection by using the same precautions used for avoiding the common cold or flu:  Marland Kitchen Wash your hands often with soap and warm water for at least 20 seconds.  If soap and water are not readily available, use an alcohol-based hand sanitizer with at least 60% alcohol.  . If coughing or sneezing, cover your mouth and nose by coughing or sneezing into the elbow areas of your shirt or coat, into a tissue or into your sleeve (not your hands). . Avoid shaking hands with others and consider head nods or verbal greetings only. . Avoid touching your eyes, nose, or mouth with unwashed hands.  . Avoid close contact with people who are sick. . Avoid places or events with large numbers of people in one location, like concerts or sporting events. . Carefully consider travel plans you have or are making. . If you are planning any travel outside or inside the [place], visit the CDC's Travelers' Health webpage for the latest health notices. . If you have some symptoms but not all symptoms, continue to monitor at home and seek medical attention if your symptoms worsen. . If you are having a medical emergency, call 911.  HOME CARE . Only take medications as instructed by your medical team. . Drink plenty of  fluids and get plenty of rest. . A steam or ultrasonic humidifier can help if you have congestion.   GET HELP RIGHT AWAY IF: . You develop worsening fever. . You become short of breath . You cough up blood. . Your symptoms become more severe MAKE SURE YOU   Understand these instructions.  Will watch your condition.  Will get help right away if you are not doing well or get worse.  Your e-visit answers were reviewed by a board certified advanced clinical practitioner to complete your personal care plan.  Depending on the condition,  your plan could have included both over the counter or prescription medications.  If there is a problem please reply once you have received a response from your provider. Your safety is important to [place].  If you have drug allergies check your prescription carefully.    You can use MyChart to ask questions about today's visit, request a non-urgent call back, or ask for a work or school excuse for 24 hours related to this e-Visit. If it has been greater than 24 hours you will need to follow up with your provider, or enter a new e-Visit to address those concerns. You will get an e-mail in the next two days asking about your experience.  I hope that your e-visit has been valuable and will speed your recovery. Thank you for using e-visits.

## 2018-08-08 DIAGNOSIS — Z20828 Contact with and (suspected) exposure to other viral communicable diseases: Secondary | ICD-10-CM | POA: Diagnosis not present

## 2018-08-08 DIAGNOSIS — J22 Unspecified acute lower respiratory infection: Secondary | ICD-10-CM | POA: Diagnosis not present

## 2018-08-09 ENCOUNTER — Ambulatory Visit: Admission: RE | Admit: 2018-08-09 | Payer: 59 | Source: Home / Self Care | Admitting: General Surgery

## 2018-08-09 ENCOUNTER — Encounter: Admission: RE | Payer: Self-pay | Source: Home / Self Care

## 2018-08-09 SURGERY — HYSTERECTOMY, TOTAL, LAPAROSCOPIC
Anesthesia: Choice

## 2018-08-09 SURGERY — REPAIR, HERNIA, UMBILICAL, ADULT
Anesthesia: General

## 2018-08-30 ENCOUNTER — Other Ambulatory Visit: Payer: Self-pay | Admitting: Family

## 2018-08-30 DIAGNOSIS — Z7189 Other specified counseling: Secondary | ICD-10-CM

## 2018-08-30 DIAGNOSIS — R059 Cough, unspecified: Secondary | ICD-10-CM

## 2018-08-30 DIAGNOSIS — R05 Cough: Secondary | ICD-10-CM

## 2018-09-19 DIAGNOSIS — R002 Palpitations: Secondary | ICD-10-CM | POA: Diagnosis not present

## 2018-09-19 DIAGNOSIS — R030 Elevated blood-pressure reading, without diagnosis of hypertension: Secondary | ICD-10-CM | POA: Diagnosis not present

## 2018-09-19 DIAGNOSIS — F419 Anxiety disorder, unspecified: Secondary | ICD-10-CM | POA: Diagnosis not present

## 2018-09-19 DIAGNOSIS — R0789 Other chest pain: Secondary | ICD-10-CM | POA: Diagnosis not present

## 2018-09-20 DIAGNOSIS — R079 Chest pain, unspecified: Secondary | ICD-10-CM | POA: Diagnosis not present

## 2018-10-08 ENCOUNTER — Telehealth: Payer: 59 | Admitting: Physician Assistant

## 2018-10-08 DIAGNOSIS — R300 Dysuria: Secondary | ICD-10-CM

## 2018-10-08 MED ORDER — NITROFURANTOIN MONOHYD MACRO 100 MG PO CAPS: 100.0000 mg | ORAL_CAPSULE | Freq: Two times a day (BID) | ORAL | 0 refills | Status: AC

## 2018-10-08 NOTE — Progress Notes (Signed)
We are sorry that you are not feeling well.  Here is how we plan to help!  Based on what you shared with me it looks like you most likely have a simple urinary tract infection.  A UTI (Urinary Tract Infection) is a bacterial infection of the bladder.  Most cases of urinary tract infections are simple to treat but a key part of your care is to encourage you to drink plenty of fluids and watch your symptoms carefully.  I have prescribed MacroBid 100 mg twice a day for 5 days.  Your symptoms should gradually improve. Call us if the burning in your urine worsens, you develop worsening fever, back pain or pelvic pain or if your symptoms do not resolve after completing the antibiotic.  Urinary tract infections can be prevented by drinking plenty of water to keep your body hydrated.  Also be sure when you wipe, wipe from front to back and don't hold it in!  If possible, empty your bladder every 4 hours.  Your e-visit answers were reviewed by a board certified advanced clinical practitioner to complete your personal care plan.  Depending on the condition, your plan could have included both over the counter or prescription medications.  If there is a problem please reply once you have received a response from your provider.  Your safety is important to [place].  If you have drug allergies check your prescription carefully.    You can use MyChart to ask questions about today's visit, request a non-urgent call back, or ask for a work or school excuse for 24 hours related to this e-Visit. If it has been greater than 24 hours you will need to follow up with your provider, or enter a new e-Visit to address those concerns.   You will get an e-mail in the next two days asking about your experience.  I hope that your e-visit has been valuable and will speed your recovery. Thank you for using e-visits.    ===View-only below this line===   ----- Message -----    From: Jessica Murphy    Sent: 10/08/2018  9:10  AM EDT      To: E-Visit Mailing List Subject: E-Visit Submission: Urinary Problems  E-Visit Submission: Urinary Problems --------------------------------  Question: Which of the following are you experiencing? Answer:   Pain while passing urine            Difficulty passing urine            Change in urine appearance or smell  Question: When you have pain when passing urine, which of these apply? Answer:   The pain feels like it is on the inside  Question: Are you able to pass urine? Answer:   Yes, I can pass urine.  Question: How long have you had pain or difficulty passing urine? Answer:   Two days or less  Question: Do you have a fever? Answer:   No, I do not have a fever  Question: Do you have any of the following? Answer:   I have none of these problems  Question: Do you have an exaggerated sensation of the need to pass urine? Answer:   Yes, the sensation is exaggerated  Question: Do you have the urge to urinate more of less frequently than normal? Answer:   More frequently  Question: What does your urine look like? Answer:   It is cloudy  Question: Do you have any of the following? Answer:   Dark red or dark  brown urine  Question: Do you have any of the following? Answer:   No discharge  Question: Do you have any sores on your genitals? Answer:   No  Question: Do you have any history of kidney dysfunction or kidney problems? Answer:   No  Question: Within the past 3 months, have you had any surgery on your kidneys or bladder, or have you had a tube inserted to collect your urine? Answer:   No, I have never had either  Question: Have you had similar symptoms in the past? Answer:   Yes, I have had similar symptoms more than once before  Question: If you had similar symptoms in the past, did any of the following work? Answer:   Pills for urine infection  Question: Please list any additional comments  Answer:   I have had UTI's in the past.  This pain and  frequency started around 11pm last night.  Question: Please list your medication allergies that you may have ? (If 'none' , please list as 'none') Answer:   None  Question: Are you pregnant? Answer:   I am confident that I am not pregnant  Question: Are you breastfeeding? Answer:   No  A total of 5-10 minutes was spent evaluating this patients questionnaire and formulating a plan of care.

## 2018-10-23 ENCOUNTER — Telehealth: Payer: Self-pay | Admitting: Obstetrics and Gynecology

## 2018-10-23 NOTE — Telephone Encounter (Signed)
Patient is aware of H&P at [facility] on 11/05/18 @ 4:10pm w/ Dr. Georgianne Fick, Pre-admit testing and COVID testing to be scheduled, and OR on 11/22/18 w/ Drs. Staebler and [employer]. Patient is aware she will be asked to quarantine after COVID testing. Patient is aware she may receive calls from the [place] and [facility]. Patient confirmed UMR and no secondary insurance. Patient is aware of phone check-in/ mask/ no visitors at [facility]. Patient will either fax or bring FMLA paperwork to her 11/05/18 appointment.

## 2018-10-24 NOTE — Telephone Encounter (Signed)
Patient contacted today in regards to surgery that will be a combined case with Dr. Bary Castilla and Dr. Georgianne Fick. Surgery was previously postponed due to COVID-19.  Patient's surgery to be scheduled for 11-22-18 at [facility] with Dr. Bary Castilla (Dr. Georgianne Fick will be doing a hysterectomy same day).  The patient is aware to have COVID-19 testing done on 11-22-18 at the [facility] drive thru ([address] [place]) between 8:00 am and 10:30 am. She is aware to isolate after, have no visitors, wash hands frequently, and avoid touching face.   The patient is aware she will be contacted by the [place] to complete a phone interview sometime in the near future.  Patient aware that she may have no visitors.  The patient will come in for a pre-op visit with Dr. Bary Castilla on 11-05-18 at 3 pm.  The patient verbalizes understanding of the above.   The patient is aware to call the office should she have further questions.

## 2018-11-01 ENCOUNTER — Telehealth: Payer: Self-pay

## 2018-11-01 NOTE — Telephone Encounter (Signed)
FMLA/DISABILITY form for [employer] filled out, signature obtained, and given to KT for processing.

## 2018-11-02 IMAGING — MG MM DIGITAL SCREENING BILAT W/ TOMO W/ CAD
8 series · 9 of 24 positions shown · non-contrast
Comparison: Previous exam(s).

CLINICAL DATA: Screening.

EXAM:
DIGITAL SCREENING BILATERAL MAMMOGRAM WITH TOMO AND CAD

[L MLO synth-2D]
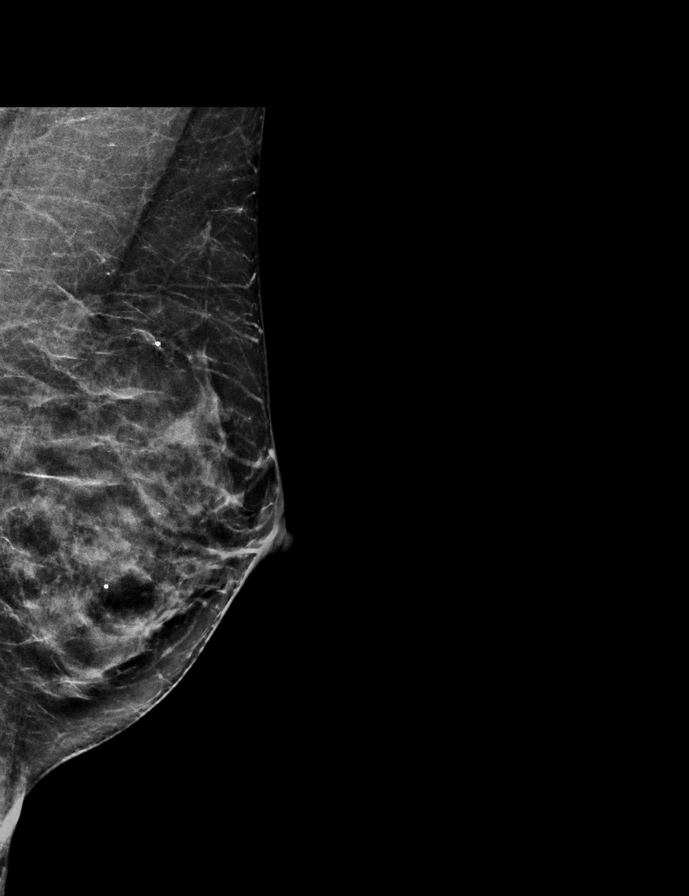

[L CC synth-2D]
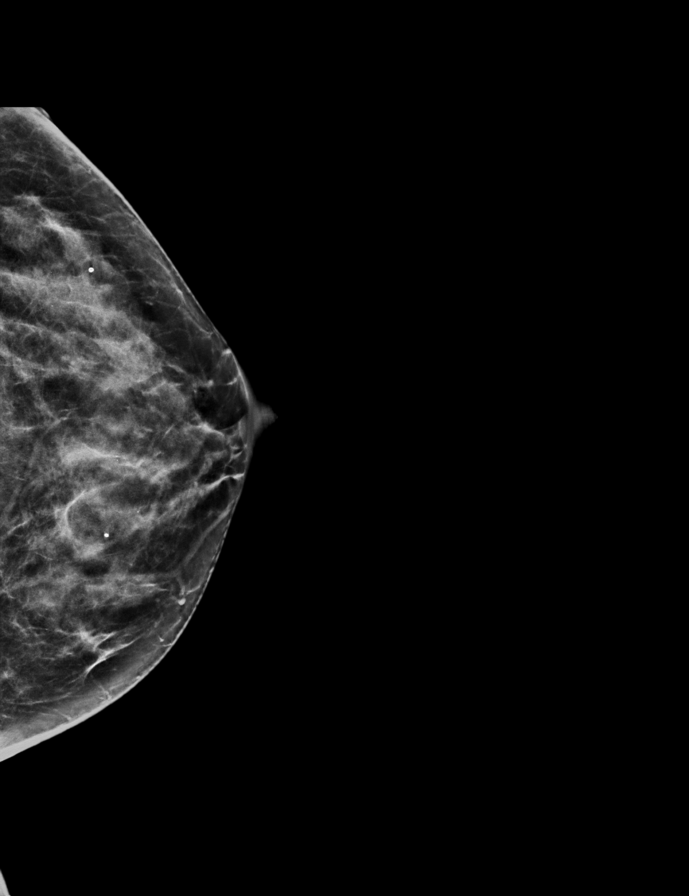

[R MLO synth-2D]
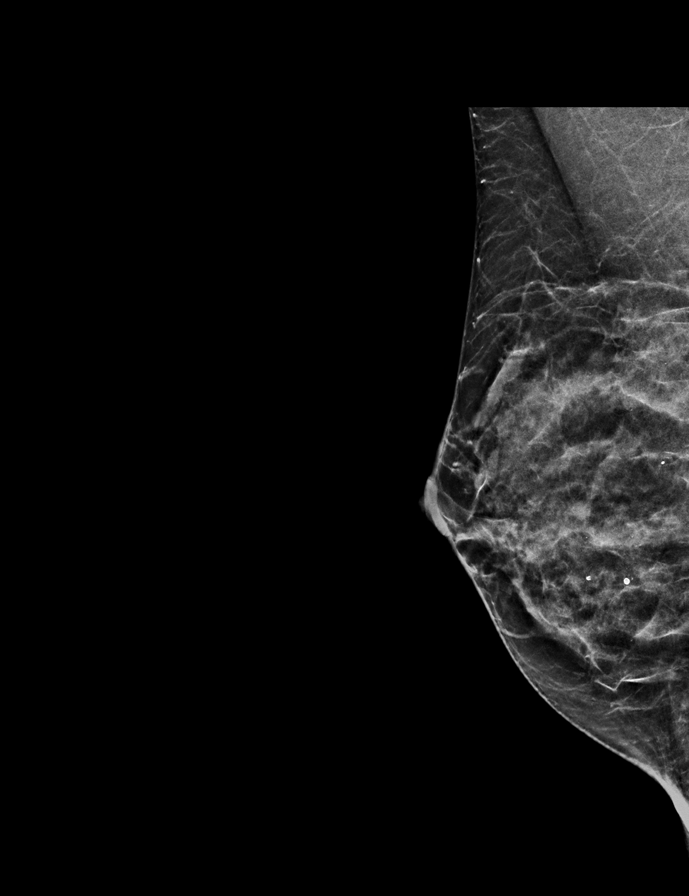

[R CC synth-2D]
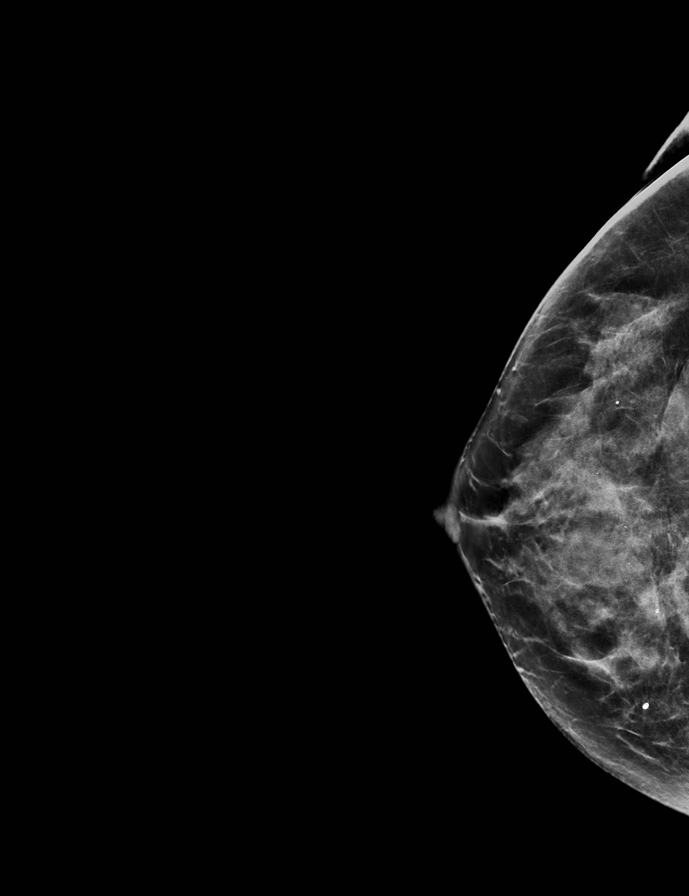

[R MLO tomo · 2 of 47 frames shown]
[frame 16/47]
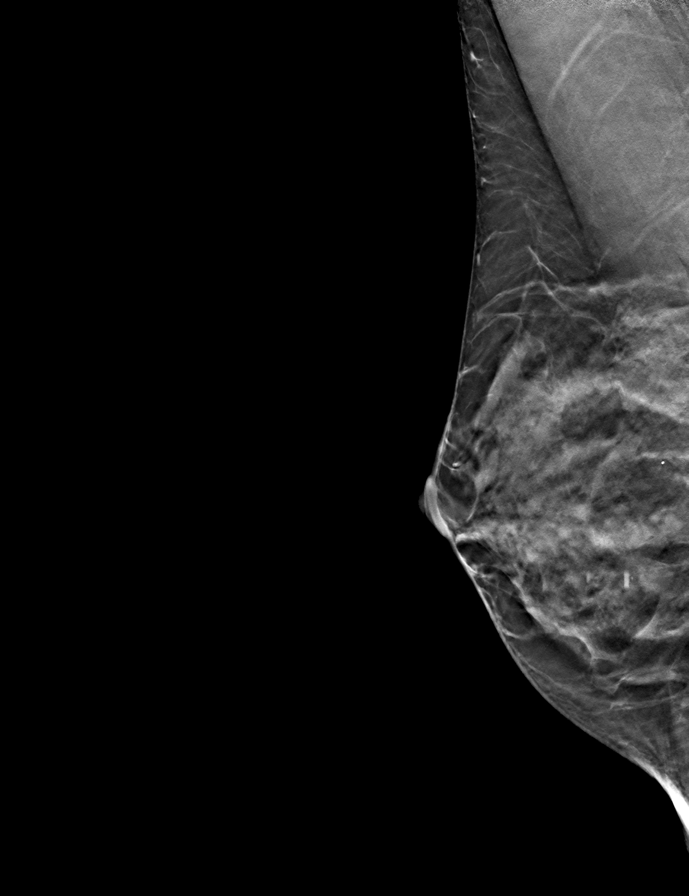
[frame 24/47]
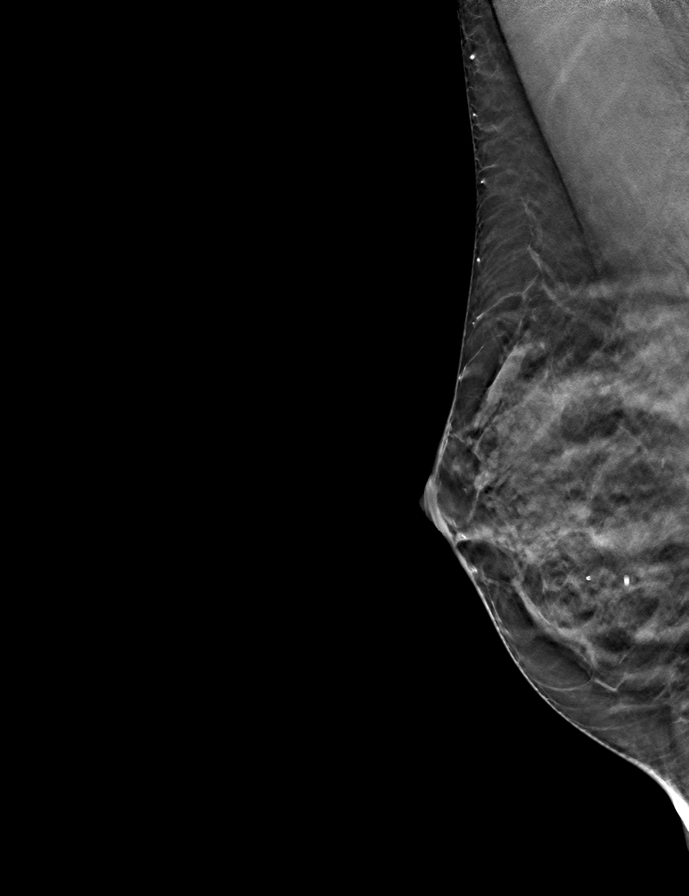

[L CC tomo · tomo slice 29/56.0]
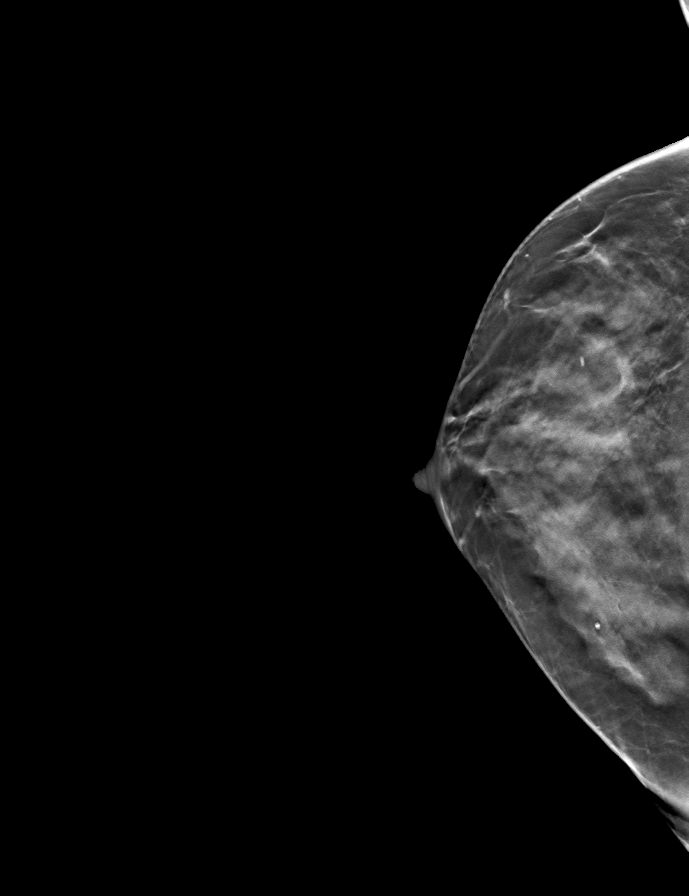

[R CC tomo · tomo slice 29/56.0]
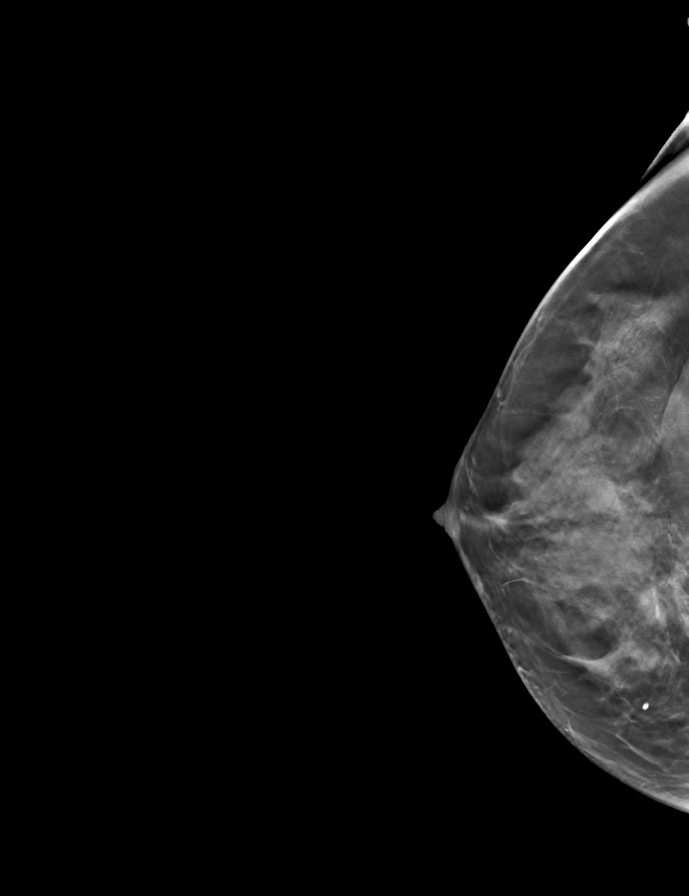

[L MLO tomo · tomo slice 27/54.0]
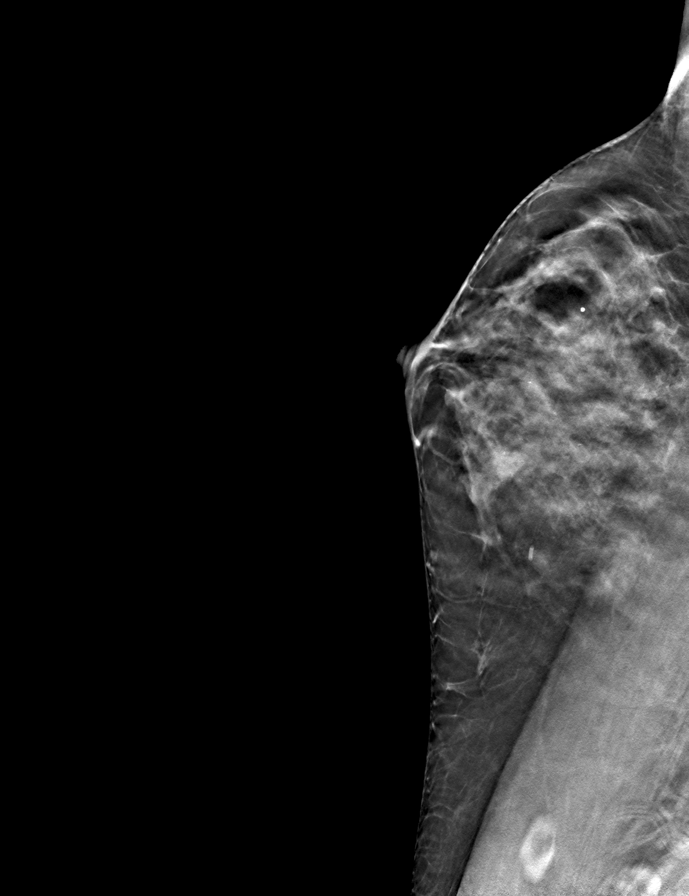

[9 of 24 positions shown; findings below may reference images not displayed]

ACR Breast Density Category c: The breast tissue is heterogeneously
dense, which may obscure small masses.
FINDINGS: There are no findings suspicious for malignancy. Images were
processed with CAD.
IMPRESSION: No mammographic evidence of malignancy. A result letter of this
screening mammogram will be mailed directly to the patient.

RECOMMENDATION:
Screening mammogram in one year. (Code:[id number]-[id number])

BI-RADS CATEGORY  1: Negative.

## 2018-11-05 ENCOUNTER — Encounter: Payer: Self-pay | Admitting: Obstetrics and Gynecology

## 2018-11-05 ENCOUNTER — Ambulatory Visit (INDEPENDENT_AMBULATORY_CARE_PROVIDER_SITE_OTHER): Payer: 59 | Admitting: Obstetrics and Gynecology

## 2018-11-05 ENCOUNTER — Ambulatory Visit (INDEPENDENT_AMBULATORY_CARE_PROVIDER_SITE_OTHER): Payer: 59 | Admitting: General Surgery

## 2018-11-05 ENCOUNTER — Other Ambulatory Visit: Payer: Self-pay

## 2018-11-05 ENCOUNTER — Encounter: Payer: Self-pay | Admitting: General Surgery

## 2018-11-05 VITALS — BP 127/87 | HR 82 | Temp 97.7°F | Resp 18 | Ht 64.0 in | Wt 139.0 lb

## 2018-11-05 VITALS — BP 110/80 | HR 69 | Ht 64.0 in | Wt 144.0 lb

## 2018-11-05 DIAGNOSIS — D250 Submucous leiomyoma of uterus: Secondary | ICD-10-CM

## 2018-11-05 DIAGNOSIS — Z01818 Encounter for other preprocedural examination: Secondary | ICD-10-CM | POA: Diagnosis not present

## 2018-11-05 DIAGNOSIS — N939 Abnormal uterine and vaginal bleeding, unspecified: Secondary | ICD-10-CM

## 2018-11-05 DIAGNOSIS — K429 Umbilical hernia without obstruction or gangrene: Secondary | ICD-10-CM

## 2018-11-05 DIAGNOSIS — D252 Subserosal leiomyoma of uterus: Secondary | ICD-10-CM

## 2018-11-05 NOTE — Patient Instructions (Signed)
Umbilical Hernia, Adult  A hernia is a bulge of tissue that pushes through an opening between muscles. An umbilical hernia happens in the abdomen, near the belly button (umbilicus). The hernia may contain tissues from the small intestine, large intestine, or fatty tissue covering the intestines (omentum). Umbilical hernias in adults tend to get worse over time, and they require surgical treatment. There are several types of umbilical hernias. You may have:  A hernia located just above or below the umbilicus (indirect hernia). This is the most common type of umbilical hernia in adults.  A hernia that forms through an opening formed by the umbilicus (direct hernia).  A hernia that comes and goes (reducible hernia). A reducible hernia may be visible only when you strain, lift something heavy, or cough. This type of hernia can be pushed back into the abdomen (reduced).  A hernia that traps abdominal tissue inside the hernia (incarcerated hernia). This type of hernia cannot be reduced.  A hernia that cuts off blood flow to the tissues inside the hernia (strangulated hernia). The tissues can start to die if this happens. This type of hernia requires emergency treatment. What are the causes? An umbilical hernia happens when tissue inside the abdomen presses on a weak area of the abdominal muscles. What increases the risk? You may have a greater risk of this condition if you:  Are obese.  Have had several pregnancies.  Have a buildup of fluid inside your abdomen (ascites).  Have had surgery that weakens the abdominal muscles. What are the signs or symptoms? The main symptom of this condition is a painless bulge at or near the belly button. A reducible hernia may be visible only when you strain, lift something heavy, or cough. Other symptoms may include:  Dull pain.  A feeling of pressure. Symptoms of a strangulated hernia may include:  Pain that gets increasingly worse.  Nausea and  vomiting.  Pain when pressing on the hernia.  Skin over the hernia becoming red or purple.  Constipation.  Blood in the stool. How is this diagnosed? This condition may be diagnosed based on:  A physical exam. You may be asked to cough or strain while standing. These actions increase the pressure inside your abdomen and force the hernia through the opening in your muscles. Your health care provider may try to reduce the hernia by pressing on it.  Your symptoms and medical history. How is this treated? Surgery is the only treatment for an umbilical hernia. Surgery for a strangulated hernia is done as soon as possible. If you have a small hernia that is not incarcerated, you may need to lose weight before having surgery. Follow these instructions at home:  Lose weight, if told by your health care provider.  Do not try to push the hernia back in.  Watch your hernia for any changes in color or size. Tell your health care provider if any changes occur.  You may need to avoid activities that increase pressure on your hernia.  Do not lift anything that is heavier than 10 lb (4.5 kg) until your health care provider says that this is safe.  Take over-the-counter and prescription medicines only as told by your health care provider.  Keep all follow-up visits as told by your health care provider. This is important. Contact a health care provider if:  Your hernia gets larger.  Your hernia becomes painful. Get help right away if:  You develop sudden, severe pain near the area of your hernia.    You have pain as well as nausea or vomiting.  You have pain and the skin over your hernia changes color.  You develop a fever. This information is not intended to replace advice given to you by your health care provider. Make sure you discuss any questions you have with your health care provider. Document Released: 09/24/2015 Document Revised: 06/06/2017 Document Reviewed: 10/23/2016 Elsevier  Patient Education  2020 [employer]  

## 2018-11-05 NOTE — Progress Notes (Signed)
Patient ID: Jessica Murphy, female   DOB: 02-Oct-1975, 43 y.o.   MRN: [id number]  Chief Complaint  Patient presents with  . Pre-op Exam    umbilical hernia    HPI Jessica Murphy is a 43 y.o. female here today for her pre op umbilical hernia scheduled 11/22/18.  The patient was initially evaluated in early February, 2020.  Surgery was postponed secondary to the corona virus pandemic. The patient continues to note some local discomfort, usually related to activity.  Symptoms began after the birth of her last child about 11 years ago. HPI  Past Medical History:  Diagnosis Date  . Blood type O-   . Menorrhagia   . Spontaneous abortion    END OF 2011/2012, 2017  . Umbilical hernia   . Uterine fibroid     Past Surgical History:  Procedure Laterality Date  . CESAREAN SECTION     X3  . HYSTEROSCOPY  03/25/2009   HYSTEROSCOPIC IUD REMOVAL  . WISDOM TOOTH EXTRACTION      Family History  Problem Relation Age of Onset  . Congestive Heart Failure Mother   . Hypertension Mother   . Thyroid disease Mother        HYPO  . Lupus Mother        ERYTHEMATOSUS  . Rheum arthritis Mother   . Sjogren's syndrome Mother   . Colitis Mother        collagenous  . Alcohol abuse Father        DECEASED 08-15-09  . Cancer Maternal Grandmother 58       COLON  . Heart disease Maternal Grandmother   . Thyroid disease Maternal Grandmother   . Heart disease Maternal Grandfather   . Diabetes Paternal Grandfather        TYPE 2  . Heart disease Paternal Grandfather   . Cancer Cousin 44       BREAST  . Alcohol abuse Cousin 43  . Diabetes Son        TYPE 1  . Thyroid disease Maternal Aunt     Social History Social History   Tobacco Use  . Smoking status: Former Research scientist (life sciences)  . Smokeless tobacco: Never Used  . Tobacco comment: QUIT 2002  Substance Use Topics  . Alcohol use: Yes    Comment: occ  . Drug use: No    Allergies  Allergen Reactions  . Guaifenesin Palpitations  . Erythromycin Nausea  And Vomiting    Current Outpatient Medications  Medication Sig Dispense Refill  . cholecalciferol (VITAMIN D3) 25 MCG (1000 UT) tablet Take 1,000 Units by mouth 2 (two) times a week.    . escitalopram (LEXAPRO) 5 MG tablet Take 5 mg by mouth daily.    Marland Kitchen ibuprofen (ADVIL,MOTRIN) 200 MG tablet Take 400 mg by mouth every 6 (six) hours as needed (pain).    Marland Kitchen levonorgestrel (MIRENA) 20 MCG/24HR IUD 1 Intra Uterine Device by Intrauterine route continuous.     No current facility-administered medications for this visit.     Review of Systems Review of Systems  Blood pressure 127/87, pulse 82, temperature 97.7 F (36.5 C), temperature source Temporal, resp. rate 18, height 5\' 4"  (1.626 m), weight 139 lb (63 kg), SpO2 100 %.  Physical Exam Physical Exam Constitutional:      Appearance: She is well-developed.  Eyes:     General: No scleral icterus.    Conjunctiva/sclera: Conjunctivae normal.  Neck:     Musculoskeletal: Neck supple.  Cardiovascular:  Rate and Rhythm: Normal rate and regular rhythm.     Heart sounds: Normal heart sounds.  Pulmonary:     Effort: Pulmonary effort is normal.     Breath sounds: Normal breath sounds.  Abdominal:    Lymphadenopathy:     Cervical: No cervical adenopathy.  Skin:    General: Skin is warm and dry.  Neurological:     Mental Status: She is alert and oriented to person, place, and time.     Data Reviewed GYN notes of November 05, 2018.  Plan for hysterectomy.  Assessment Candidate for umbilical hernia repair in conjunction with upcoming hysterectomy.  Plan The patient has a small defect, and I do not anticipate any place for prosthetic or bioabsorbable mesh placement.  The small area of diastases does not require intervention.  Surgery is being coordinated with the GYN service.    Jessica Murphy Jessica Murphy 11/06/2018, 8:39 PM  Patient aware she will need to Pre-admit per Dr. Danielle Murphy request on 11-19-18 at 9 am. The patient is to have  COVID testing the same day immediately after.   Surgery as scheduled for 11-22-18 with Dr. Bary Murphy and Dr. Georgianne Murphy.   Patient is aware to follow further instructions per [facility].   The patient is aware to call our office should she have further questions.   Jessica Murphy, CMA

## 2018-11-06 NOTE — Progress Notes (Signed)
Obstetrics & Gynecology Surgery H&P    Chief Complaint: Scheduled Surgery   History of Present Illness: Patient is a 43 y.o. [id number] presenting for scheduled TLH, BS, cystoscopy for uterine fibroids with concurrent hernia repair by general surgery.  Prior Treatments prior to proceeding with surgery include: medical management, Mirena IUD  Preoperative Pap: 12/04/2017 NIL HPV negative Preoperative Endometrial biopsy:12/29/2016 negative for hyperplasia or malignancy Preoperative Ultrasound: 07/05/2018 Three dominant uterine fibroids  Fibroid 1:  1.3 x 1.3 x 1.3cm  SS Fibroid 2:  5.2 x 4.7 x 4.3cm  IM vs SM Fibroid 3:  1.5 x 1.2 x 1.0cm  SS EMS of 6.36mm   Review of Systems:10 point review of systems  Past Medical History:  Past Medical History:  Diagnosis Date   Blood type O-    Menorrhagia    Spontaneous abortion    END OF 5631/4970, 2637   Umbilical hernia    Uterine fibroid     Past Surgical History:  Past Surgical History:  Procedure Laterality Date   CESAREAN SECTION     X3   HYSTEROSCOPY  03/25/2009   HYSTEROSCOPIC IUD REMOVAL   WISDOM TOOTH EXTRACTION      Family History:  Family History  Problem Relation Age of Onset   Congestive Heart Failure Mother    Hypertension Mother    Thyroid disease Mother        HYPO   Lupus Mother        ERYTHEMATOSUS   Rheum arthritis Mother    Sjogren's syndrome Mother    Colitis Mother        collagenous   Alcohol abuse Father        DECEASED 07-31-09   Cancer Maternal Grandmother 22       COLON   Heart disease Maternal Grandmother    Thyroid disease Maternal Grandmother    Heart disease Maternal Grandfather    Diabetes Paternal Grandfather        TYPE 2   Heart disease Paternal Grandfather    Cancer Cousin 24       BREAST   Alcohol abuse Cousin 75   Diabetes Son        TYPE 1   Thyroid disease Maternal Aunt     Social History:  Social History   Socioeconomic History   Marital  status: Married    Spouse name: Not on file   Number of children: 3   Years of education: 16   Highest education level: Not on file  Occupational History   Occupation: NURSE    Comment: [employer] - [employer]  Social Designer, fashion/clothing strain: Not on file   Food insecurity    Worry: Not on file    Inability: Not on file   Transportation needs    Medical: Not on file    Non-medical: Not on file  Tobacco Use   Smoking status: Former Smoker   Smokeless tobacco: Never Used   Tobacco comment: QUIT 2000/07/31  Substance and Sexual Activity   Alcohol use: Yes    Comment: occ   Drug use: No   Sexual activity: Yes    Partners: Male    Birth control/protection: I.U.D.  Lifestyle   Physical activity    Days per week: Not on file    Minutes per session: Not on file   Stress: Not on file  Relationships   Social connections    Talks on phone: Not on file    Gets together:  Not on file    Attends religious service: Not on file    Active member of club or organization: Not on file    Attends meetings of clubs or organizations: Not on file    Relationship status: Not on file   Intimate partner violence    Fear of current or ex partner: Not on file    Emotionally abused: Not on file    Physically abused: Not on file    Forced sexual activity: Not on file  Other Topics Concern   Not on file  Social History Narrative   Not on file    Allergies:  Allergies  Allergen Reactions   Guaifenesin Palpitations   Erythromycin Nausea And Vomiting    Medications: Prior to Admission medications   Medication Sig Start Date End Date Taking? Authorizing Provider  cholecalciferol (VITAMIN D3) 25 MCG (1000 UT) tablet Take 1,000 Units by mouth 2 (two) times a week.   Yes [provider]  escitalopram (LEXAPRO) 5 MG tablet Take 5 mg by mouth daily.   Yes [provider]  ibuprofen (ADVIL,MOTRIN) 200 MG tablet Take 400 mg by mouth every 6 (six) hours as  needed (pain).   Yes [provider]  levonorgestrel (MIRENA) 20 MCG/24HR IUD 1 Intra Uterine Device by Intrauterine route continuous.   Yes [provider]    Physical Exam Vitals: Blood pressure 110/80, pulse 69, height 5\' 4"  (1.626 m), weight 144 lb (65.3 kg). General: NAD, well nourished, appears stated age 66: normocephalic, anicteric Pulmonary: No increased work of breathing Cardiovascular: RRR, distal pulses 2+ Abdomen:  Soft, non-tender Genitourinary: deferred Extremities: no edema, erythema, or tenderness Neurologic: Grossly intact Psychiatric: mood appropriate, affect full  Imaging No results found.  Assessment: 43 y.o. [id number] presenting for scheduled TLH, BS, cystoscopy  Plan: 1) Patient opts for definitive surgical management via hysterectomy. The risks of surgery were discussed in detail with the patient including but not limited to: bleeding which may require transfusion or reoperation; infection which may require antibiotics; injury to bowel, bladder, ureters or other surrounding organs (With a literature reported rate of urinary tract injury of 1% quoted); need for additional procedures including laparotomy; thromboembolic phenomenon, incisional problems and other postoperative/anesthesia complications.  Patient was also advised that recovery procedure generally involves an overnight stay; and the  expected recovery time after a hysterectomy being in the range of 6-8 weeks.  Likelihood of success in alleviating the patient's symptoms was discussed.  While definitive in regards to issues with menstural bleeding, pelvic pain if present preoperatively may continue and in fact worsen postoperatively.  She is aware that the procedure will render her unable to pursue childbearing in the future.   She was told that she will be contacted by our surgical scheduler regarding the time and date of her surgery; routine preoperative instructions of having nothing to eat  or drink after midnight on the day prior to surgery and also coming to the hospital 1.5 hours prior to her time of surgery were also emphasized.  She was told she may be called for a preoperative appointment about a week prior to surgery and will be given further preoperative instructions at that visit.  Routine postoperative instructions will be reviewed with the patient and her family in detail after surgery. Printed patient education handouts about the procedure was given to the patient to review at home.   2) Routine postoperative instructions were reviewed with the patient and her family in detail today including the expected  length of recovery and likely postoperative course.  The patient concurred with the proposed plan, giving informed written consent for the surgery today.  Patient instructed on the importance of being NPO after midnight prior to her procedure.  If warranted preoperative prophylactic antibiotics and SCDs ordered on call to the OR to meet SCIP guidelines and adhere to recommendation laid forth in [place] Number 104 May 2009  "Antibiotic Prophylaxis for Gynecologic Procedures".     Malachy Mood, MD, [facility], [facility] 11/06/2018, 5:31 PM

## 2018-11-15 ENCOUNTER — Telehealth: Payer: Self-pay

## 2018-11-15 ENCOUNTER — Other Ambulatory Visit: Payer: Self-pay | Admitting: Obstetrics and Gynecology

## 2018-11-15 DIAGNOSIS — N3000 Acute cystitis without hematuria: Secondary | ICD-10-CM

## 2018-11-15 MED ORDER — SULFAMETHOXAZOLE-TRIMETHOPRIM 800-160 MG PO TABS: 1.0000 | ORAL_TABLET | Freq: Two times a day (BID) | ORAL | 0 refills | Status: AC

## 2018-11-15 NOTE — Telephone Encounter (Signed)
Please advise. I will contact pt if you send something in

## 2018-11-15 NOTE — Telephone Encounter (Signed)
Okay to send prescription in, please find pharmacy information and let me know,

## 2018-11-15 NOTE — Telephone Encounter (Signed)
Pharmacy placed in pts chart, [facility] in [place]

## 2018-11-15 NOTE — Telephone Encounter (Signed)
Pt calling; believes she has a UTI; is at [facility], [place].  Can someone call in [facility] in [place]?  [phone]

## 2018-11-18 ENCOUNTER — Other Ambulatory Visit: Payer: 59

## 2018-11-18 ENCOUNTER — Other Ambulatory Visit: Payer: Self-pay | Admitting: Advanced Practice Midwife

## 2018-11-18 DIAGNOSIS — B3731 Acute candidiasis of vulva and vagina: Secondary | ICD-10-CM

## 2018-11-18 DIAGNOSIS — B373 Candidiasis of vulva and vagina: Secondary | ICD-10-CM

## 2018-11-18 MED ORDER — FLUCONAZOLE 150 MG PO TABS: 150.0000 mg | ORAL_TABLET | Freq: Once | ORAL | 1 refills | Status: AC

## 2018-11-18 NOTE — Progress Notes (Signed)
Rx Diflucan sent to patient pharmacy for yeast infection she developed while taking antibiotics for uti.

## 2018-11-19 ENCOUNTER — Other Ambulatory Visit: Payer: Self-pay

## 2018-11-19 ENCOUNTER — Encounter
Admission: RE | Admit: 2018-11-19 | Discharge: 2018-11-19 | Disposition: A | Discharge status: Home / Self Care | Payer: 59 | Source: Ambulatory Visit | Attending: General Surgery | Admitting: General Surgery

## 2018-11-19 DIAGNOSIS — Z01812 Encounter for preprocedural laboratory examination: Secondary | ICD-10-CM | POA: Insufficient documentation

## 2018-11-19 DIAGNOSIS — Z1159 Encounter for screening for other viral diseases: Secondary | ICD-10-CM | POA: Insufficient documentation

## 2018-11-19 HISTORY — DX: Other complications of anesthesia, initial encounter: T88.59XA

## 2018-11-19 LAB — SARS CORONAVIRUS 2 (TAT 6-24 HRS): SARS Coronavirus 2: NEGATIVE

## 2018-11-19 LAB — TYPE AND SCREEN
ABO/RH(D): O NEG
Antibody Screen: NEGATIVE

## 2018-11-19 LAB — BASIC METABOLIC PANEL
Anion gap: 10 (ref 5–15)
BUN: 18 mg/dL (ref 6–20)
CO2: 23 mmol/L (ref 22–32)
Calcium: 9.5 mg/dL (ref 8.9–10.3)
Chloride: 102 mmol/L (ref 98–111)
Creatinine, Ser: 0.8 mg/dL (ref 0.44–1.00)
GFR calc Af Amer: >60 mL/min (ref >60)
GFR calc non Af Amer: >60 mL/min (ref >60)
Glucose, Bld: 106 mg/dL — ABNORMAL HIGH (ref 70–99)
Potassium: 4.1 mmol/L (ref 3.5–5.1)
Sodium: 135 mmol/L (ref 135–145)

## 2018-11-19 LAB — CBC
HCT: 43.9 % (ref 36.0–46.0)
Hemoglobin: 14.6 g/dL (ref 12.0–15.0)
MCH: 32.8 pg (ref 26.0–34.0)
MCHC: 33.3 g/dL (ref 30.0–36.0)
MCV: 98.7 fL (ref 80.0–100.0)
Platelets: 236 10*3/uL (ref 150–400)
RBC: 4.45 MIL/uL (ref 3.87–5.11)
RDW: 12.3 % (ref 11.5–15.5)
WBC: 5.3 10*3/uL (ref 4.0–10.5)
nRBC: 0 % (ref 0.0–0.2)

## 2018-11-19 NOTE — Patient Instructions (Signed)
Your procedure is scheduled on: 11-22-18 FRIDAY Report to Same Day Surgery 2nd floor medical mall [facility] Entrance-take elevator on left to 2nd floor.  Check in with surgery information desk.) To find out your arrival time please call [phone] between 1PM - 3PM on 11-21-18 THURSDAY  Remember: Instructions that are not followed completely may result in serious medical risk, up to and including death, or upon the discretion of your surgeon and anesthesiologist your surgery may need to be rescheduled.    _x___ 1. Do not eat food after midnight the night before your procedure. NO GUM OR CANDY AFTER MIDNIGHT. You may drink clear liquids up to 2 hours before you are scheduled to arrive at the hospital for your procedure.  Do not drink clear liquids within 2 hours of your scheduled arrival to the hospital.  Clear liquids include  --Water or Apple juice without pulp  --Clear carbohydrate beverage such as ClearFast or Gatorade  --Black Coffee or Clear Tea (No milk, no creamers, do not add anything to the coffee or Tea   ____Ensure clear carbohydrate drink on the way to the hospital for bariatric patients  _X___Ensure clear carbohydrate drink 3 hours before surgery      __x__ 2. No Alcohol for 24 hours before or after surgery.   __x__3. No Smoking or e-cigarettes for 24 prior to surgery.  Do not use any chewable tobacco products for at least 6 hour prior to surgery   ____  4. Bring all medications with you on the day of surgery if instructed.    __x__ 5. Notify your doctor if there is any change in your medical condition     (cold, fever, infections).    x___6. On the morning of surgery brush your teeth with toothpaste and water.  You may rinse your mouth with mouth wash if you wish.  Do not swallow any toothpaste or mouthwash.   Do not wear jewelry, make-up, hairpins, clips or nail polish.  Do not wear lotions, powders, or perfumes. You may wear deodorant.  Do not shave 48 hours  prior to surgery. Men may shave face and neck.  Do not bring valuables to the hospital.    [facility] is not responsible for any belongings or valuables.               Contacts, dentures or bridgework may not be worn into surgery.  Leave your suitcase in the car. After surgery it may be brought to your room.  For patients admitted to the hospital, discharge time is determined by your treatment team.  _  Patients discharged the day of surgery will not be allowed to drive home.  You will need someone to drive you home and stay with you the night of your procedure.    Please read over the following fact sheets that you were given:   [facility] Preparing for Surgery   _x___ TAKE THE FOLLOWING MEDICATION THE MORNING OF SURGERY WITH A SMALL SIP OF WATER. These include:  1. LEXAPRO  2. YOU MAY TAKE KLONOPIN IF NEEDED DAY OF SURGERY  3.  4.  5.  6.  ____Fleets enema or Magnesium Citrate as directed.   _x___ Use CHG Soap or sage wipes as directed on instruction sheet   ____ Use inhalers on the day of surgery and bring to hospital day of surgery  ____ Stop Metformin and Janumet 2 days prior to surgery.    ____ Take 1/2 of usual insulin dose the  night before surgery and none on the morning surgery.   ____ Follow recommendations from Cardiologist, Pulmonologist or PCP regarding  stopping Aspirin, Coumadin, Plavix ,Eliquis, Effient, or Pradaxa, and Pletal.  X____Stop Anti-inflammatories such as Advil, Aleve, Ibuprofen, Motrin, Naproxen, Naprosyn, Goodies powders, EXCEDRIN or aspirin products NOW-OK to take Tylenol   ____ Stop supplements until after surgery.   ____ Bring C-Pap to the hospital.

## 2018-11-21 ENCOUNTER — Encounter: Payer: Self-pay | Admitting: Anesthesiology

## 2018-11-21 MED ORDER — CEFAZOLIN SODIUM-DEXTROSE 2-4 GM/100ML-% IV SOLN: 2.0000 g | INTRAVENOUS | Status: DC

## 2018-11-22 ENCOUNTER — Other Ambulatory Visit
Admission: RE | Admit: 2018-11-22 | Discharge: 2018-11-22 | Disposition: A | Discharge status: Home / Self Care | Payer: 59 | Source: Ambulatory Visit | Attending: Obstetrics and Gynecology | Admitting: Obstetrics and Gynecology

## 2018-11-22 ENCOUNTER — Other Ambulatory Visit: Payer: Self-pay

## 2018-11-22 ENCOUNTER — Telehealth: Payer: Self-pay

## 2018-11-22 ENCOUNTER — Ambulatory Visit: Admission: RE | Admit: 2018-11-22 | Payer: 59 | Source: Home / Self Care | Admitting: General Surgery

## 2018-11-22 ENCOUNTER — Encounter: Admission: RE | Payer: Self-pay | Source: Home / Self Care

## 2018-11-22 DIAGNOSIS — Z01812 Encounter for preprocedural laboratory examination: Secondary | ICD-10-CM | POA: Diagnosis not present

## 2018-11-22 DIAGNOSIS — Z1159 Encounter for screening for other viral diseases: Secondary | ICD-10-CM | POA: Diagnosis not present

## 2018-11-22 LAB — SARS CORONAVIRUS 2 (TAT 6-24 HRS): SARS Coronavirus 2: NEGATIVE

## 2018-11-22 SURGERY — REPAIR, HERNIA, UMBILICAL, ADULT
Anesthesia: General

## 2018-11-22 NOTE — Telephone Encounter (Signed)
Spoke with Izora Gala at [facility] about rescheduling patient's surgery with Dr Bary Castilla an Dr Georgianne Fick that was for 11/22/18.  He would like to schedule this to be done with Dr Dahlia Byes on 11/28/18. I have spoken with Dr Dahlia Byes and this date will work for him. He will need to see the patient in office prior to surgery.  I called and spoke with the patient and she is amendable to surgery on 11/28/18. She will be seen in office by Dr Dahlia Byes on 11/25/18 at 10:00 am.  Izora Gala at [facility] will call and find out if the patient needs to have her Covid-19 test repeated or not. She will contact the patient about this.

## 2018-11-25 ENCOUNTER — Encounter: Payer: Self-pay | Admitting: Surgery

## 2018-11-25 ENCOUNTER — Other Ambulatory Visit: Payer: Self-pay

## 2018-11-25 ENCOUNTER — Ambulatory Visit (INDEPENDENT_AMBULATORY_CARE_PROVIDER_SITE_OTHER): Payer: 59 | Admitting: Surgery

## 2018-11-25 VITALS — BP 119/80 | HR 78 | Temp 97.9°F | Ht 64.0 in | Wt 140.0 lb

## 2018-11-25 DIAGNOSIS — K429 Umbilical hernia without obstruction or gangrene: Secondary | ICD-10-CM

## 2018-11-25 NOTE — Patient Instructions (Signed)
Surgery scheduled on 11/28/2018.

## 2018-11-26 NOTE — Progress Notes (Signed)
Patient ID: Jessica Murphy, female   DOB: 03/04/1976, 43 y.o.   MRN: [id number]  HPI Jessica Murphy is a 43 y.o. female, seen with evaluation of umbilical hernia.  She initially was seen Dr. Bary Castilla but since he no longer with the practice I took over her care.  Reports some intermittent pain from her umbilical hernia that she has had it for years.  No fevers no chills no evidence of incarceration no evidence of previous hernia repairs.  HPI  Past Medical History:  Diagnosis Date  . Blood type O-   . Complication of anesthesia    bp dropped with 1st spinal  . Menorrhagia   . Spontaneous abortion    END OF 2011/2012, 2017  . Umbilical hernia   . Uterine fibroid     Past Surgical History:  Procedure Laterality Date  . CESAREAN SECTION     X3  . HYSTEROSCOPY  03/25/2009   HYSTEROSCOPIC IUD REMOVAL  . WISDOM TOOTH EXTRACTION      Family History  Problem Relation Age of Onset  . Congestive Heart Failure Mother   . Hypertension Mother   . Thyroid disease Mother        HYPO  . Lupus Mother        ERYTHEMATOSUS  . Rheum arthritis Mother   . Sjogren's syndrome Mother   . Colitis Mother        collagenous  . Alcohol abuse Father        DECEASED 15-Aug-2009  . Cancer Maternal Grandmother 58       COLON  . Heart disease Maternal Grandmother   . Thyroid disease Maternal Grandmother   . Heart disease Maternal Grandfather   . Diabetes Paternal Grandfather        TYPE 2  . Heart disease Paternal Grandfather   . Cancer Cousin 44       BREAST  . Alcohol abuse Cousin 43  . Diabetes Son        TYPE 1  . Thyroid disease Maternal Aunt     Social History Social History   Tobacco Use  . Smoking status: Former Smoker    Types: Cigarettes    Quit date: 11/18/2000    Years since quitting: 18.0  . Smokeless tobacco: Never Used  . Tobacco comment: more socailly  Substance Use Topics  . Alcohol use: Yes    Comment: occ  . Drug use: No    Allergies  Allergen Reactions  .  Guaifenesin Palpitations  . Bactrim [Sulfamethoxazole-Trimethoprim]     Yeast infection and diarrhea  . Erythromycin Nausea And Vomiting    Current Outpatient Medications  Medication Sig Dispense Refill  . aspirin-acetaminophen-caffeine (EXCEDRIN MIGRAINE) 250-250-65 MG tablet Take 2 tablets by mouth every 6 (six) hours as needed for headache.    . Cholecalciferol (VITAMIN D) 50 MCG (2000 UT) tablet Take 2,000 Units by mouth 3 (three) times a week.    . clonazePAM (KLONOPIN) 0.5 MG tablet Take 0.25 mg by mouth daily as needed for anxiety.    . docusate sodium (COLACE) 100 MG capsule Take 100 mg by mouth daily.    Marland Kitchen escitalopram (LEXAPRO) 5 MG tablet Take 5 mg by mouth every morning.     . fluconazole (DIFLUCAN) 150 MG tablet Take 150 mg by mouth once.    Marland Kitchen ibuprofen (ADVIL,MOTRIN) 200 MG tablet Take 400 mg by mouth every 6 (six) hours as needed (pain).    Marland Kitchen levonorgestrel (MIRENA) 20 MCG/24HR IUD 1 Intra  Uterine Device by Intrauterine route continuous.     No current facility-administered medications for this visit.      Review of Systems Full ROS  was asked and was negative except for the information on the HPI  Physical Exam Blood pressure 119/80, pulse 78, temperature 97.9 F (36.6 C), height 5\' 4"  (1.626 m), weight 140 lb (63.5 kg), last menstrual period 11/17/2018, SpO2 99 %. CONSTITUTIONAL: NAD EYES: Pupils are equal, round, Sclera are non-icteric. EARS, NOSE, MOUTH AND THROAT: The oropharynx is clear. The oral mucosa is pink and moist. Hearing is intact to voice. LYMPH NODES:  Lymph nodes in the neck are normal. RESPIRATORY:  Lungs are clear. There is normal respiratory effort, with equal breath sounds bilaterally, and without pathologic use of accessory muscles. CARDIOVASCULAR: Heart is regular without murmurs, gallops, or rubs. GI: The abdomen is soft, nontender, and nondistended. Umbilical hernia measuring approximately 1.5 cm this is reducible and mildly tender to  palpation.  No peritonitis there are no palpable masses. There is no hepatosplenomegaly. There are normal bowel sounds in all quadrants. GU: Rectal deferred.   MUSCULOSKELETAL: Normal muscle strength and tone. No cyanosis or edema.   SKIN: Turgor is good and there are no pathologic skin lesions or ulcers. NEUROLOGIC: Motor and sensation is grossly normal. Cranial nerves are grossly intact. PSYCH:  Oriented to person, place and time. Affect is normal.  Data Reviewed  I have personally reviewed the patient's imaging, laboratory findings and medical records.    Assessment/Plan 43 year old female with a minimal symptomatic umbilical hernia with an upcoming elective laparoscopic sigmoidectomy by Dr. Georgianne Fick.  Cussed with the patient in detail about the procedure of repair and umbilical hernia during the same hysterectomy setting.  Risk, benefits and possible complications including but not limited to: Bleeding, infection, need for mesh and chronic pain.  She understands and wishes to proceed.  I have also discussed with Dr. Georgianne Fick in detail the operative approach and likely he will use the hernia defect for port placement Time spent with the patient was 25 minutes, with more than 50% of the time spent in face-to-face education, counseling and care coordination.     Caroleen Hamman, MD FACS General Surgeon 11/26/2018, 3:19 PM

## 2018-11-26 NOTE — H&P (View-Only) (Signed)
Patient ID: Jessica Murphy, female   DOB: [birth year], 43 y.o.   MRN: [id number]  HPI Jessica Murphy is a 43 y.o. female, seen with evaluation of umbilical hernia.  She initially was seen Dr. Bary Castilla but since he no longer with the practice I took over her care.  Reports some intermittent pain from her umbilical hernia that she has had it for years.  No fevers no chills no evidence of incarceration no evidence of previous hernia repairs.  HPI  Past Medical History:  Diagnosis Date  . Blood type O-   . Complication of anesthesia    bp dropped with 1st spinal  . Menorrhagia   . Spontaneous abortion    END OF 2011/2012, 2017  . Umbilical hernia   . Uterine fibroid     Past Surgical History:  Procedure Laterality Date  . CESAREAN SECTION     X3  . HYSTEROSCOPY  03/25/2009   HYSTEROSCOPIC IUD REMOVAL  . WISDOM TOOTH EXTRACTION      Family History  Problem Relation Age of Onset  . Congestive Heart Failure Mother   . Hypertension Mother   . Thyroid disease Mother        HYPO  . Lupus Mother        ERYTHEMATOSUS  . Rheum arthritis Mother   . Sjogren's syndrome Mother   . Colitis Mother        collagenous  . Alcohol abuse Father        DECEASED 08-22-09  . Cancer Maternal Grandmother 58       COLON  . Heart disease Maternal Grandmother   . Thyroid disease Maternal Grandmother   . Heart disease Maternal Grandfather   . Diabetes Paternal Grandfather        TYPE 2  . Heart disease Paternal Grandfather   . Cancer Cousin 44       BREAST  . Alcohol abuse Cousin 43  . Diabetes Son        TYPE 1  . Thyroid disease Maternal Aunt     Social History Social History   Tobacco Use  . Smoking status: Former Smoker    Types: Cigarettes    Quit date: 11/18/2000    Years since quitting: 18.0  . Smokeless tobacco: Never Used  . Tobacco comment: more socailly  Substance Use Topics  . Alcohol use: Yes    Comment: occ  . Drug use: No    Allergies  Allergen Reactions  .  Guaifenesin Palpitations  . Bactrim [Sulfamethoxazole-Trimethoprim]     Yeast infection and diarrhea  . Erythromycin Nausea And Vomiting    Current Outpatient Medications  Medication Sig Dispense Refill  . aspirin-acetaminophen-caffeine (EXCEDRIN MIGRAINE) 250-250-65 MG tablet Take 2 tablets by mouth every 6 (six) hours as needed for headache.    . Cholecalciferol (VITAMIN D) 50 MCG (2000 UT) tablet Take 2,000 Units by mouth 3 (three) times a week.    . clonazePAM (KLONOPIN) 0.5 MG tablet Take 0.25 mg by mouth daily as needed for anxiety.    . docusate sodium (COLACE) 100 MG capsule Take 100 mg by mouth daily.    Marland Kitchen escitalopram (LEXAPRO) 5 MG tablet Take 5 mg by mouth every morning.     . fluconazole (DIFLUCAN) 150 MG tablet Take 150 mg by mouth once.    Marland Kitchen ibuprofen (ADVIL,MOTRIN) 200 MG tablet Take 400 mg by mouth every 6 (six) hours as needed (pain).    Marland Kitchen levonorgestrel (MIRENA) 20 MCG/24HR IUD 1 Intra  Uterine Device by Intrauterine route continuous.     No current facility-administered medications for this visit.      Review of Systems Full ROS  was asked and was negative except for the information on the HPI  Physical Exam Blood pressure 119/80, pulse 78, temperature 97.9 F (36.6 C), height 5\' 4"  (1.626 m), weight 140 lb (63.5 kg), last menstrual period 11/17/2018, SpO2 99 %. CONSTITUTIONAL: NAD EYES: Pupils are equal, round, Sclera are non-icteric. EARS, NOSE, MOUTH AND THROAT: The oropharynx is clear. The oral mucosa is pink and moist. Hearing is intact to voice. LYMPH NODES:  Lymph nodes in the neck are normal. RESPIRATORY:  Lungs are clear. There is normal respiratory effort, with equal breath sounds bilaterally, and without pathologic use of accessory muscles. CARDIOVASCULAR: Heart is regular without murmurs, gallops, or rubs. GI: The abdomen is soft, nontender, and nondistended. Umbilical hernia measuring approximately 1.5 cm this is reducible and mildly tender to  palpation.  No peritonitis there are no palpable masses. There is no hepatosplenomegaly. There are normal bowel sounds in all quadrants. GU: Rectal deferred.   MUSCULOSKELETAL: Normal muscle strength and tone. No cyanosis or edema.   SKIN: Turgor is good and there are no pathologic skin lesions or ulcers. NEUROLOGIC: Motor and sensation is grossly normal. Cranial nerves are grossly intact. PSYCH:  Oriented to person, place and time. Affect is normal.  Data Reviewed  I have personally reviewed the patient's imaging, laboratory findings and medical records.    Assessment/Plan 43 year old female with a minimal symptomatic umbilical hernia with an upcoming elective laparoscopic sigmoidectomy by Dr. Georgianne Fick.  Cussed with the patient in detail about the procedure of repair and umbilical hernia during the same hysterectomy setting.  Risk, benefits and possible complications including but not limited to: Bleeding, infection, need for mesh and chronic pain.  She understands and wishes to proceed.  I have also discussed with Dr. Georgianne Fick in detail the operative approach and likely he will use the hernia defect for port placement Time spent with the patient was 25 minutes, with more than 50% of the time spent in face-to-face education, counseling and care coordination.     Caroleen Hamman, MD FACS General Surgeon 11/26/2018, 3:19 PM

## 2018-11-27 MED ORDER — CEFAZOLIN SODIUM-DEXTROSE 2-4 GM/100ML-% IV SOLN
2.0000 g | INTRAVENOUS | Status: AC
  Administered 2018-11-28: 2 g via INTRAVENOUS

## 2018-11-28 ENCOUNTER — Encounter: Payer: Self-pay | Admitting: *Deleted

## 2018-11-28 ENCOUNTER — Ambulatory Visit: Payer: 59 | Admitting: Certified Registered Nurse Anesthetist

## 2018-11-28 ENCOUNTER — Other Ambulatory Visit: Payer: Self-pay

## 2018-11-28 ENCOUNTER — Encounter
Admission: RE | Disposition: A | Discharge status: Home / Self Care | Payer: Self-pay | Source: Home / Self Care | Attending: Obstetrics and Gynecology

## 2018-11-28 ENCOUNTER — Observation Stay
Admission: RE | Admit: 2018-11-28 | Discharge: 2018-11-29 | Disposition: A | Discharge status: Home / Self Care | Payer: 59 | Attending: Obstetrics and Gynecology | Admitting: Obstetrics and Gynecology

## 2018-11-28 DIAGNOSIS — N838 Other noninflammatory disorders of ovary, fallopian tube and broad ligament: Secondary | ICD-10-CM | POA: Diagnosis not present

## 2018-11-28 DIAGNOSIS — K429 Umbilical hernia without obstruction or gangrene: Secondary | ICD-10-CM

## 2018-11-28 DIAGNOSIS — Z79899 Other long term (current) drug therapy: Secondary | ICD-10-CM | POA: Insufficient documentation

## 2018-11-28 DIAGNOSIS — N939 Abnormal uterine and vaginal bleeding, unspecified: Secondary | ICD-10-CM | POA: Insufficient documentation

## 2018-11-28 DIAGNOSIS — Z87891 Personal history of nicotine dependence: Secondary | ICD-10-CM | POA: Insufficient documentation

## 2018-11-28 DIAGNOSIS — N800 Endometriosis of uterus: Secondary | ICD-10-CM | POA: Insufficient documentation

## 2018-11-28 DIAGNOSIS — Z888 Allergy status to other drugs, medicaments and biological substances status: Secondary | ICD-10-CM | POA: Diagnosis not present

## 2018-11-28 DIAGNOSIS — D259 Leiomyoma of uterus, unspecified: Secondary | ICD-10-CM | POA: Diagnosis not present

## 2018-11-28 DIAGNOSIS — F419 Anxiety disorder, unspecified: Secondary | ICD-10-CM | POA: Insufficient documentation

## 2018-11-28 DIAGNOSIS — D251 Intramural leiomyoma of uterus: Secondary | ICD-10-CM | POA: Diagnosis not present

## 2018-11-28 DIAGNOSIS — Z90710 Acquired absence of both cervix and uterus: Secondary | ICD-10-CM

## 2018-11-28 DIAGNOSIS — Z881 Allergy status to other antibiotic agents status: Secondary | ICD-10-CM | POA: Insufficient documentation

## 2018-11-28 DIAGNOSIS — Z7982 Long term (current) use of aspirin: Secondary | ICD-10-CM | POA: Diagnosis not present

## 2018-11-28 DIAGNOSIS — Z793 Long term (current) use of hormonal contraceptives: Secondary | ICD-10-CM | POA: Insufficient documentation

## 2018-11-28 DIAGNOSIS — N3010 Interstitial cystitis (chronic) without hematuria: Secondary | ICD-10-CM | POA: Insufficient documentation

## 2018-11-28 DIAGNOSIS — Z30432 Encounter for removal of intrauterine contraceptive device: Secondary | ICD-10-CM | POA: Insufficient documentation

## 2018-11-28 HISTORY — PX: LAPAROSCOPIC HYSTERECTOMY: SHX1926

## 2018-11-28 HISTORY — PX: UMBILICAL HERNIA REPAIR: SHX196

## 2018-11-28 LAB — ABO/RH: ABO/RH(D): O NEG

## 2018-11-28 LAB — POCT PREGNANCY, URINE: Preg Test, Ur: NEGATIVE

## 2018-11-28 SURGERY — HYSTERECTOMY, TOTAL, LAPAROSCOPIC
Anesthesia: General

## 2018-11-28 MED ORDER — FENTANYL CITRATE (PF) 100 MCG/2ML IJ SOLN
INTRAMUSCULAR | Status: AC
  Filled 2018-11-28: qty 2

## 2018-11-28 MED ORDER — FENTANYL CITRATE (PF) 100 MCG/2ML IJ SOLN
25.0000 ug | INTRAMUSCULAR | Status: DC | PRN
  Administered 2018-11-28 (×2): 25 ug via INTRAVENOUS
  Administered 2018-11-28: 16:00:00 50 ug via INTRAVENOUS

## 2018-11-28 MED ORDER — ONDANSETRON HCL 4 MG/2ML IJ SOLN
INTRAMUSCULAR | Status: AC
  Filled 2018-11-28: qty 2

## 2018-11-28 MED ORDER — FENTANYL CITRATE (PF) 100 MCG/2ML IJ SOLN
INTRAMUSCULAR | Status: AC
  Administered 2018-11-28: 50 ug via INTRAVENOUS
  Filled 2018-11-28: qty 2

## 2018-11-28 MED ORDER — FAMOTIDINE 20 MG PO TABS
ORAL_TABLET | ORAL | Status: AC
  Administered 2018-11-28: 12:00:00 20 mg via ORAL
  Filled 2018-11-28: qty 1

## 2018-11-28 MED ORDER — KETOROLAC TROMETHAMINE 30 MG/ML IJ SOLN
INTRAMUSCULAR | Status: DC | PRN
  Administered 2018-11-28: 30 mg via INTRAVENOUS

## 2018-11-28 MED ORDER — MEPERIDINE HCL 50 MG/ML IJ SOLN: 6.2500 mg | INTRAMUSCULAR | Status: DC | PRN

## 2018-11-28 MED ORDER — MIDAZOLAM HCL 2 MG/2ML IJ SOLN
INTRAMUSCULAR | Status: DC | PRN
  Administered 2018-11-28: 2 mg via INTRAVENOUS

## 2018-11-28 MED ORDER — ROCURONIUM BROMIDE 100 MG/10ML IV SOLN
INTRAVENOUS | Status: DC | PRN
  Administered 2018-11-28: 10 mg via INTRAVENOUS
  Administered 2018-11-28: 20 mg via INTRAVENOUS
  Administered 2018-11-28 (×2): 10 mg via INTRAVENOUS
  Administered 2018-11-28: 50 mg via INTRAVENOUS

## 2018-11-28 MED ORDER — LACTATED RINGERS IV SOLN
INTRAVENOUS | Status: DC
  Administered 2018-11-28: 12:00:00 via INTRAVENOUS

## 2018-11-28 MED ORDER — CEFAZOLIN SODIUM-DEXTROSE 2-4 GM/100ML-% IV SOLN
INTRAVENOUS | Status: AC
  Filled 2018-11-28: qty 100

## 2018-11-28 MED ORDER — LIDOCAINE HCL (PF) 2 % IJ SOLN
INTRAMUSCULAR | Status: AC
  Filled 2018-11-28: qty 10

## 2018-11-28 MED ORDER — ACETAMINOPHEN 10 MG/ML IV SOLN
INTRAVENOUS | Status: DC | PRN
  Administered 2018-11-28: 1000 mg via INTRAVENOUS

## 2018-11-28 MED ORDER — SUGAMMADEX SODIUM 200 MG/2ML IV SOLN
INTRAVENOUS | Status: DC | PRN
  Administered 2018-11-28: 150 mg via INTRAVENOUS

## 2018-11-28 MED ORDER — KETOROLAC TROMETHAMINE 30 MG/ML IJ SOLN
30.0000 mg | Freq: Four times a day (QID) | INTRAMUSCULAR | Status: DC | PRN
  Administered 2018-11-28 – 2018-11-29 (×3): 30 mg via INTRAVENOUS
  Filled 2018-11-28 (×3): qty 1

## 2018-11-28 MED ORDER — MIDAZOLAM HCL 2 MG/2ML IJ SOLN
INTRAMUSCULAR | Status: AC
  Filled 2018-11-28: qty 2

## 2018-11-28 MED ORDER — BUPIVACAINE HCL 0.5 % IJ SOLN
INTRAMUSCULAR | Status: DC | PRN
  Administered 2018-11-28: 18 mL
  Administered 2018-11-28: 10 mL

## 2018-11-28 MED ORDER — OXYCODONE-ACETAMINOPHEN 5-325 MG PO TABS
1.0000 | ORAL_TABLET | ORAL | Status: DC | PRN
  Administered 2018-11-28 – 2018-11-29 (×3): 1 via ORAL
  Filled 2018-11-28 (×3): qty 1

## 2018-11-28 MED ORDER — FAMOTIDINE 20 MG PO TABS
20.0000 mg | ORAL_TABLET | Freq: Once | ORAL | Status: AC
  Administered 2018-11-28: 20 mg via ORAL

## 2018-11-28 MED ORDER — ROCURONIUM BROMIDE 50 MG/5ML IV SOLN
INTRAVENOUS | Status: AC
  Filled 2018-11-28: qty 1

## 2018-11-28 MED ORDER — OXYCODONE HCL 5 MG PO TABS
ORAL_TABLET | ORAL | Status: AC
  Filled 2018-11-28: qty 1

## 2018-11-28 MED ORDER — ONDANSETRON HCL 4 MG PO TABS: 4.0000 mg | ORAL_TABLET | Freq: Four times a day (QID) | ORAL | Status: DC | PRN

## 2018-11-28 MED ORDER — ONDANSETRON HCL 4 MG/2ML IJ SOLN: 4.0000 mg | Freq: Four times a day (QID) | INTRAMUSCULAR | Status: DC | PRN

## 2018-11-28 MED ORDER — FENTANYL CITRATE (PF) 100 MCG/2ML IJ SOLN
INTRAMUSCULAR | Status: DC | PRN
  Administered 2018-11-28 (×3): 50 ug via INTRAVENOUS

## 2018-11-28 MED ORDER — DEXAMETHASONE SODIUM PHOSPHATE 10 MG/ML IJ SOLN
INTRAMUSCULAR | Status: AC
  Filled 2018-11-28: qty 1

## 2018-11-28 MED ORDER — KETOROLAC TROMETHAMINE 30 MG/ML IJ SOLN
INTRAMUSCULAR | Status: AC
  Filled 2018-11-28: qty 1

## 2018-11-28 MED ORDER — DEXTROSE-NACL 5-0.45 % IV SOLN
INTRAVENOUS | Status: DC
  Administered 2018-11-28: 17:00:00 via INTRAVENOUS

## 2018-11-28 MED ORDER — LIDOCAINE HCL (CARDIAC) PF 100 MG/5ML IV SOSY
PREFILLED_SYRINGE | INTRAVENOUS | Status: DC | PRN
  Administered 2018-11-28: 80 mg via INTRAVENOUS

## 2018-11-28 MED ORDER — HEMOSTATIC AGENTS (NO CHARGE) OPTIME
TOPICAL | Status: DC | PRN
  Administered 2018-11-28: 1 via TOPICAL

## 2018-11-28 MED ORDER — PROMETHAZINE HCL 25 MG/ML IJ SOLN: 6.2500 mg | INTRAMUSCULAR | Status: DC | PRN

## 2018-11-28 MED ORDER — MENTHOL 3 MG MT LOZG
1.0000 | LOZENGE | OROMUCOSAL | Status: DC | PRN
  Filled 2018-11-28: qty 9

## 2018-11-28 MED ORDER — MORPHINE SULFATE (PF) 2 MG/ML IV SOLN: 1.0000 mg | INTRAVENOUS | Status: DC | PRN

## 2018-11-28 MED ORDER — PHENYLEPHRINE HCL (PRESSORS) 10 MG/ML IV SOLN
INTRAVENOUS | Status: DC | PRN
  Administered 2018-11-28 (×3): 100 ug via INTRAVENOUS
  Administered 2018-11-28: 200 ug via INTRAVENOUS
  Administered 2018-11-28: 100 ug via INTRAVENOUS

## 2018-11-28 MED ORDER — OXYCODONE HCL 5 MG/5ML PO SOLN: 5.0000 mg | Freq: Once | ORAL | Status: AC | PRN

## 2018-11-28 MED ORDER — SIMETHICONE 80 MG PO CHEW
80.0000 mg | CHEWABLE_TABLET | Freq: Four times a day (QID) | ORAL | Status: DC | PRN
  Administered 2018-11-28 – 2018-11-29 (×2): 80 mg via ORAL
  Filled 2018-11-28 (×2): qty 1

## 2018-11-28 MED ORDER — OXYCODONE HCL 5 MG PO TABS
5.0000 mg | ORAL_TABLET | Freq: Once | ORAL | Status: AC | PRN
  Administered 2018-11-28: 5 mg via ORAL

## 2018-11-28 MED ORDER — EPHEDRINE SULFATE 50 MG/ML IJ SOLN
INTRAMUSCULAR | Status: DC | PRN
  Administered 2018-11-28: 5 mg via INTRAVENOUS
  Administered 2018-11-28: 10 mg via INTRAVENOUS

## 2018-11-28 MED ORDER — PROPOFOL 10 MG/ML IV BOLUS
INTRAVENOUS | Status: DC | PRN
  Administered 2018-11-28: 20 mg via INTRAVENOUS
  Administered 2018-11-28: 130 mg via INTRAVENOUS
  Administered 2018-11-28: 50 mg via INTRAVENOUS

## 2018-11-28 MED ORDER — DEXAMETHASONE SODIUM PHOSPHATE 10 MG/ML IJ SOLN
INTRAMUSCULAR | Status: DC | PRN
  Administered 2018-11-28: 10 mg via INTRAVENOUS

## 2018-11-28 MED ORDER — SUGAMMADEX SODIUM 200 MG/2ML IV SOLN
INTRAVENOUS | Status: AC
  Filled 2018-11-28: qty 2

## 2018-11-28 MED ORDER — PROPOFOL 10 MG/ML IV BOLUS
INTRAVENOUS | Status: AC
  Filled 2018-11-28: qty 20

## 2018-11-28 MED ORDER — FLUORESCEIN SODIUM 10 % IV SOLN
INTRAVENOUS | Status: AC
  Filled 2018-11-28: qty 5

## 2018-11-28 MED ORDER — ACETAMINOPHEN NICU IV SYRINGE 10 MG/ML
INTRAVENOUS | Status: AC
  Filled 2018-11-28: qty 1

## 2018-11-28 MED ORDER — ONDANSETRON HCL 4 MG/2ML IJ SOLN
INTRAMUSCULAR | Status: DC | PRN
  Administered 2018-11-28: 4 mg via INTRAVENOUS

## 2018-11-28 SURGICAL SUPPLY — 77 items
ADH SKN CLS APL DERMABOND .7 (GAUZE/BANDAGES/DRESSINGS) ×2
APL PRP STRL LF DISP 70% ISPRP (MISCELLANEOUS) ×2
APPLIER CLIP 11 MED OPEN (CLIP) ×2
APR CLP MED 11 20 MLT OPN (CLIP) ×1
BAG URINE DRAINAGE (UROLOGICAL SUPPLIES) ×2 IMPLANT
BLADE CLIPPER SURG (BLADE) ×2 IMPLANT
BLADE SURG 15 STRL LF DISP TIS (BLADE) ×1 IMPLANT
BLADE SURG 15 STRL SS (BLADE) ×2
BLADE SURG SZ11 CARB STEEL (BLADE) ×2 IMPLANT
CANISTER SUCT 1200ML W/VALVE (MISCELLANEOUS) ×4 IMPLANT
CATH FOLEY 2WAY  5CC 16FR (CATHETERS) ×1
CATH FOLEY 2WAY 5CC 16FR (CATHETERS) ×1
CATH URTH 16FR FL 2W BLN LF (CATHETERS) ×1 IMPLANT
CHLORAPREP W/TINT 26 (MISCELLANEOUS) ×4 IMPLANT
CLIP APPLIE 11 MED OPEN (CLIP) ×1 IMPLANT
COVER WAND RF STERILE (DRAPES) ×2 IMPLANT
DEFOGGER SCOPE WARMER CLEARIFY (MISCELLANEOUS) ×2 IMPLANT
DERMABOND ADVANCED (GAUZE/BANDAGES/DRESSINGS) ×2
DERMABOND ADVANCED .7 DNX12 (GAUZE/BANDAGES/DRESSINGS) ×1 IMPLANT
DEVICE SUTURE ENDOST 10MM (ENDOMECHANICALS) ×2 IMPLANT
DRAPE INCISE IOBAN 66X45 STRL (DRAPES) ×2 IMPLANT
DRAPE LAPAROTOMY 77X122 PED (DRAPES) ×2 IMPLANT
DRESSING SURGICEL FIBRLLR 1X2 (HEMOSTASIS) IMPLANT
DRSG SURGICEL FIBRILLAR 1X2 (HEMOSTASIS) ×2
DRSG TELFA 3X8 NADH (GAUZE/BANDAGES/DRESSINGS) ×2 IMPLANT
ELECT CAUTERY BLADE 6.4 (BLADE) ×2 IMPLANT
ELECT REM PT RETURN 9FT ADLT (ELECTROSURGICAL) ×2
ELECTRODE REM PT RTRN 9FT ADLT (ELECTROSURGICAL) ×1 IMPLANT
GLOVE BIO SURGEON STRL SZ7 (GLOVE) ×10 IMPLANT
GLOVE INDICATOR 7.5 STRL GRN (GLOVE) ×2 IMPLANT
GOWN STRL REUS W/ TWL LRG LVL3 (GOWN DISPOSABLE) ×4 IMPLANT
GOWN STRL REUS W/ TWL XL LVL3 (GOWN DISPOSABLE) ×1 IMPLANT
GOWN STRL REUS W/TWL LRG LVL3 (GOWN DISPOSABLE) ×8
GOWN STRL REUS W/TWL XL LVL3 (GOWN DISPOSABLE) ×2
GRASPER SUT TROCAR 14GX15 (MISCELLANEOUS) ×2 IMPLANT
IRRIGATION STRYKERFLOW (MISCELLANEOUS) ×1 IMPLANT
IRRIGATOR STRYKERFLOW (MISCELLANEOUS) ×2
IV LACTATED RINGERS 1000ML (IV SOLUTION) ×2 IMPLANT
IV NS 1000ML (IV SOLUTION) ×2
IV NS 1000ML BAXH (IV SOLUTION) ×1 IMPLANT
KIT PINK PAD W/HEAD ARE REST (MISCELLANEOUS) ×2
KIT PINK PAD W/HEAD ARM REST (MISCELLANEOUS) ×1 IMPLANT
LABEL OR SOLS (LABEL) ×2 IMPLANT
MANIPULATOR VCARE LG CRV RETR (MISCELLANEOUS) IMPLANT
MANIPULATOR VCARE SML CRV RETR (MISCELLANEOUS) IMPLANT
MANIPULATOR VCARE STD CRV RETR (MISCELLANEOUS) IMPLANT
NEEDLE HYPO 22GX1.5 SAFETY (NEEDLE) ×2 IMPLANT
NS IRRIG 500ML POUR BTL (IV SOLUTION) ×4 IMPLANT
OCCLUDER COLPOPNEUMO (BALLOONS) IMPLANT
PACK BASIN MINOR ARMC (MISCELLANEOUS) ×2 IMPLANT
PACK GYN LAPAROSCOPIC (MISCELLANEOUS) ×2 IMPLANT
PAD DRESSING TELFA 3X8 NADH (GAUZE/BANDAGES/DRESSINGS) ×1 IMPLANT
PAD OB MATERNITY 4.3X12.25 (PERSONAL CARE ITEMS) ×2 IMPLANT
PAD PREP 24X41 OB/GYN DISP (PERSONAL CARE ITEMS) ×2 IMPLANT
PENCIL ELECTRO HAND CTR (MISCELLANEOUS) ×1 IMPLANT
SCISSORS METZENBAUM CVD 33 (INSTRUMENTS) ×2 IMPLANT
SET CYSTO W/LG BORE CLAMP LF (SET/KITS/TRAYS/PACK) ×2 IMPLANT
SHEARS HARMONIC ACE PLUS 36CM (ENDOMECHANICALS) ×2 IMPLANT
SLEEVE ENDOPATH XCEL 5M (ENDOMECHANICALS) ×2 IMPLANT
SPONGE LAP 18X18 RF (DISPOSABLE) ×2 IMPLANT
STRAP SAFETY 5IN WIDE (MISCELLANEOUS) ×2 IMPLANT
SUT ENDO VLOC 180-0-8IN (SUTURE) ×2 IMPLANT
SUT ETHIBOND NAB MO 7 #0 18IN (SUTURE) ×2 IMPLANT
SUT MNCRL 4-0 (SUTURE) ×4
SUT MNCRL 4-0 27XMFL (SUTURE) ×2
SUT MNCRL AB 4-0 PS2 18 (SUTURE) ×2 IMPLANT
SUT VIC AB 0 CT1 27 (SUTURE) ×2
SUT VIC AB 0 CT1 27XCR 8 STRN (SUTURE) ×1 IMPLANT
SUT VIC AB 3-0 SH 27 (SUTURE) ×2
SUT VIC AB 3-0 SH 27X BRD (SUTURE) ×1 IMPLANT
SUTURE MNCRL 4-0 27XMF (SUTURE) ×2 IMPLANT
SYR 10ML LL (SYRINGE) ×2 IMPLANT
SYR 20ML LL LF (SYRINGE) ×2 IMPLANT
SYR 50ML LL SCALE MARK (SYRINGE) ×2 IMPLANT
TROCAR ENDO BLADELESS 11MM (ENDOMECHANICALS) ×2 IMPLANT
TROCAR XCEL NON-BLD 5MMX100MML (ENDOMECHANICALS) ×2 IMPLANT
TUBING EVAC SMOKE HEATED PNEUM (TUBING) ×2 IMPLANT

## 2018-11-28 NOTE — Interval H&P Note (Signed)
History and Physical Interval Note:  11/28/2018 11:41 AM  Jessica Murphy  has presented today for surgery, with the diagnosis of UMBILICAL HERNIA, UTERINE FIBROIDS, ABNORMAL UTERINE BLEEDING.  The various methods of treatment have been discussed with the patient and family. After consideration of risks, benefits and other options for treatment, the patient has consented to  Procedure(s): HYSTERECTOMY TOTAL LAPAROSCOPIC (N/A) HERNIA REPAIR UMBILICAL ADULT (N/A) as a surgical intervention.  The patient's history has been reviewed, patient examined, no change in status, stable for surgery.  I have reviewed the patient's chart and labs.  Questions were answered to the patient's satisfaction.     [place]

## 2018-11-28 NOTE — Transfer of Care (Signed)
Immediate Anesthesia Transfer of Care Note  Patient: Jessica Murphy  Procedure(s) Performed: HYSTERECTOMY TOTAL LAPAROSCOPIC (N/A ) HERNIA REPAIR UMBILICAL ADULT (N/A )  Patient Location: PACU  Anesthesia Type:General  Level of Consciousness: awake, alert  and oriented  Airway & Oxygen Therapy: Patient Spontanous Breathing and Patient connected to face mask oxygen  Post-op Assessment: Report given to RN and Post -op Vital signs reviewed and stable  Post vital signs: Reviewed and stable  Last Vitals:  Vitals Value Taken Time  BP 112/79 11/28/18 1459  Temp 36.2 C 11/28/18 1459  Pulse 100 11/28/18 1459  Resp 15 11/28/18 1459  SpO2 100 % 11/28/18 1459  Vitals shown include unvalidated device data.  Last Pain:  Vitals:   11/28/18 1459  TempSrc: Temporal  PainSc:          Complications: No apparent anesthesia complications

## 2018-11-28 NOTE — Brief Op Note (Signed)
11/28/2018  2:35 PM  PATIENT:  Jessica Murphy  43 y.o. female  PRE-OPERATIVE DIAGNOSIS:  UMBILICAL HERNIA, UTERINE FIBROIDS, ABNORMAL UTERINE BLEEDING  POST-OPERATIVE DIAGNOSIS:  UMBILICAL HERNIA, UTERINE FIBROIDS, ABNORMAL UTERINE BLEEDING  PROCEDURE:  Procedure(s): HYSTERECTOMY TOTAL LAPAROSCOPIC (N/A) HERNIA REPAIR UMBILICAL ADULT (N/A)  SURGEON:  Surgeon(s) and Role: Panel 1:    Malachy Mood, MD - Primary Panel 2:    * Pabon, Marjory Lies, MD - Primary  PHYSICIAN ASSISTANT: Dr. Barnett Applebaum  ASSISTANTS: none   ANESTHESIA:   general  EBL:  150 mL   BLOOD ADMINISTERED:none  DRAINS: Urinary Catheter (Foley)   LOCAL MEDICATIONS USED:  MARCAINE     SPECIMEN:  Source of Specimen:  uterus, cervix, and tubes  DISPOSITION OF SPECIMEN:  PATHOLOGY  COUNTS:  YES  TOURNIQUET:  * No tourniquets in log *  DICTATION: .Note written in EPIC  PLAN OF CARE: Admit for overnight observation  PATIENT DISPOSITION:  PACU - hemodynamically stable.   Delay start of Pharmacological VTE agent (>24hrs) due to surgical blood loss or risk of bleeding: yes

## 2018-11-28 NOTE — H&P (Signed)
Date of Initial H&P: 11/18/2018  History reviewed, patient examined, no change in status, stable for surgery.

## 2018-11-28 NOTE — Op Note (Signed)
  Umbilical Hernia Repair ( Primary)  Pre-operative Diagnosis: umbilical hernia  Post-operative Diagnosis: same  Surgeon: Caroleen Hamman, MD FACS  Anesthesia: Gen. with endotracheal tube   Findings: 1.2 cm UH  Estimated Blood Loss: minimal                    Complications: none              Procedure Details  The patient was seen again in the Holding Room. The benefits, complications, treatment options, and expected outcomes were discussed with the patient. The risks of bleeding, infection, recurrence of symptoms, failure to resolve symptoms, bowel injury, mesh placement, mesh infection, any of which could require further surgery were reviewed with the patient. The likelihood of improving the patient's symptoms with return to their baseline status is good.  The patient and/or family concurred with the proposed plan, giving informed consent.    Please see Dr. Georgianne Fick operative note for details regarding hysterectomy. Arrived to the operating room the patient was prepped and draped already and Dr. Georgianne Fick had use the umbilical defect as one of his 11 mm ports. Wound Inspected and I  visualized the hernia sac. Electrocautery was used to dissect through subcutaneous tissue, the hernia sac was  excised.  The hernia was measured , 1.2 cms greatest diameter.  I closed the hernia defect with interrupted 0 Ethibond sutures. Tension free repair and no need for mesh placement.  Incision was closed in a 2 layer fashion with 3-0 Vicryl and 4-0 Monocryl. Dermabond was used to coat the skin. Marcaine quarter percent with epinephrine and lidocaine 1% was used to inject  the incision sites. Patient tolerated procedure well and there were no immediate complications. Needle and laparotomy counts were correct.

## 2018-11-28 NOTE — Anesthesia Procedure Notes (Signed)
Procedure Name: Intubation Date/Time: 11/28/2018 12:34 PM Performed by: Johnna Acosta, CRNA Pre-anesthesia Checklist: Patient identified, Emergency Drugs available, Suction available, Patient being monitored and Timeout performed Patient Re-evaluated:Patient Re-evaluated prior to induction Oxygen Delivery Method: Circle system utilized Preoxygenation: Pre-oxygenation with 100% oxygen Induction Type: IV induction Ventilation: Mask ventilation without difficulty Laryngoscope Size: Berisha and 2 Grade View: Grade I Tube type: Oral Tube size: 7.0 mm Number of attempts: 1 Airway Equipment and Method: Stylet and Oral airway Placement Confirmation: ETT inserted through vocal cords under direct vision,  positive ETCO2 and breath sounds checked- equal and bilateral Secured at: 21 cm Tube secured with: Tape Dental Injury: Teeth and Oropharynx as per pre-operative assessment

## 2018-11-28 NOTE — Anesthesia Preprocedure Evaluation (Signed)
Anesthesia Evaluation  Patient identified by MRN, date of birth, ID band Patient awake    Reviewed: Allergy & Precautions, NPO status , Patient's Chart, lab work & pertinent test results  History of Anesthesia Complications Negative for: history of anesthetic complications  Airway Mallampati: II  TM Distance: >3 FB Neck ROM: Full    Dental no notable dental hx.    Pulmonary neg pulmonary ROS, neg sleep apnea, neg COPD, former smoker,    breath sounds clear to auscultation- rhonchi (-) wheezing      Cardiovascular Exercise Tolerance: Good (-) hypertension(-) angina(-) CAD, (-) Past MI, (-) Cardiac Stents and (-) CABG  Rhythm:Regular Rate:Normal - Systolic murmurs and - Diastolic murmurs    Neuro/Psych neg Seizures negative neurological ROS  negative psych ROS   GI/Hepatic negative GI ROS, Neg liver ROS,   Endo/Other  negative endocrine ROSneg diabetes  Renal/GU negative Renal ROS     Musculoskeletal negative musculoskeletal ROS (+)   Abdominal (+) - obese,   Peds  Hematology negative hematology ROS (+)   Anesthesia Other Findings Past Medical History: No date: Blood type O- No date: Complication of anesthesia     Comment:  bp dropped with 1st spinal No date: Menorrhagia No date: Spontaneous abortion     Comment:  END OF 2011/2012, 3016 No date: Umbilical hernia No date: Uterine fibroid   Reproductive/Obstetrics                             Anesthesia Physical Anesthesia Plan  ASA: II  Anesthesia Plan: General   Post-op Pain Management:    Induction: Intravenous  PONV Risk Score and Plan: 2 and Ondansetron, Dexamethasone and Midazolam  Airway Management Planned: Oral ETT  Additional Equipment:   Intra-op Plan:   Post-operative Plan: Extubation in OR  Informed Consent: I have reviewed the patients History and Physical, chart, labs and discussed the procedure including the  risks, benefits and alternatives for the proposed anesthesia with the patient or authorized representative who has indicated his/her understanding and acceptance.     Dental advisory given  Plan Discussed with: CRNA and Anesthesiologist  Anesthesia Plan Comments:         Anesthesia Quick Evaluation

## 2018-11-28 NOTE — Anesthesia Postprocedure Evaluation (Signed)
Anesthesia Post Note  Patient: ILIANY LOSIER  Procedure(s) Performed: HYSTERECTOMY TOTAL LAPAROSCOPIC (N/A ) HERNIA REPAIR UMBILICAL ADULT (N/A )  Patient location during evaluation: PACU Anesthesia Type: General Level of consciousness: awake and alert and oriented Pain management: pain level controlled Vital Signs Assessment: post-procedure vital signs reviewed and stable Respiratory status: spontaneous breathing, nonlabored ventilation and respiratory function stable Cardiovascular status: blood pressure returned to baseline and stable Postop Assessment: no signs of nausea or vomiting Anesthetic complications: no     Last Vitals:  Vitals:   11/28/18 1459 11/28/18 1514  BP: 112/79 120/75  Pulse: 97 86  Resp: 13 15  Temp: (!) 36.2 C   SpO2: 100% 100%    Last Pain:  Vitals:   11/28/18 1514  TempSrc:   PainSc: 0-No pain                 Zanya Lindo

## 2018-11-28 NOTE — Anesthesia Post-op Follow-up Note (Signed)
Anesthesia QCDR form completed.        

## 2018-11-29 ENCOUNTER — Encounter: Payer: Self-pay | Admitting: Obstetrics and Gynecology

## 2018-11-29 DIAGNOSIS — N939 Abnormal uterine and vaginal bleeding, unspecified: Secondary | ICD-10-CM | POA: Diagnosis not present

## 2018-11-29 DIAGNOSIS — Z30432 Encounter for removal of intrauterine contraceptive device: Secondary | ICD-10-CM | POA: Diagnosis not present

## 2018-11-29 DIAGNOSIS — N3289 Other specified disorders of bladder: Secondary | ICD-10-CM | POA: Diagnosis not present

## 2018-11-29 DIAGNOSIS — R1030 Lower abdominal pain, unspecified: Secondary | ICD-10-CM | POA: Diagnosis not present

## 2018-11-29 DIAGNOSIS — Z7982 Long term (current) use of aspirin: Secondary | ICD-10-CM | POA: Diagnosis not present

## 2018-11-29 DIAGNOSIS — N838 Other noninflammatory disorders of ovary, fallopian tube and broad ligament: Secondary | ICD-10-CM | POA: Diagnosis not present

## 2018-11-29 DIAGNOSIS — D251 Intramural leiomyoma of uterus: Secondary | ICD-10-CM | POA: Diagnosis not present

## 2018-11-29 DIAGNOSIS — Z881 Allergy status to other antibiotic agents status: Secondary | ICD-10-CM | POA: Diagnosis not present

## 2018-11-29 DIAGNOSIS — K429 Umbilical hernia without obstruction or gangrene: Secondary | ICD-10-CM | POA: Diagnosis not present

## 2018-11-29 DIAGNOSIS — N800 Endometriosis of uterus: Secondary | ICD-10-CM | POA: Diagnosis not present

## 2018-11-29 DIAGNOSIS — N3010 Interstitial cystitis (chronic) without hematuria: Secondary | ICD-10-CM | POA: Diagnosis not present

## 2018-11-29 DIAGNOSIS — N938 Other specified abnormal uterine and vaginal bleeding: Secondary | ICD-10-CM | POA: Diagnosis not present

## 2018-11-29 LAB — CBC
HCT: 31.5 % — ABNORMAL LOW (ref 36.0–46.0)
Hemoglobin: 10.8 g/dL — ABNORMAL LOW (ref 12.0–15.0)
MCH: 33.2 pg (ref 26.0–34.0)
MCHC: 34.3 g/dL (ref 30.0–36.0)
MCV: 96.9 fL (ref 80.0–100.0)
Platelets: 203 10*3/uL (ref 150–400)
RBC: 3.25 MIL/uL — ABNORMAL LOW (ref 3.87–5.11)
RDW: 12.1 % (ref 11.5–15.5)
WBC: 11.7 10*3/uL — ABNORMAL HIGH (ref 4.0–10.5)
nRBC: 0 % (ref 0.0–0.2)

## 2018-11-29 LAB — BASIC METABOLIC PANEL
Anion gap: 7 (ref 5–15)
BUN: 9 mg/dL (ref 6–20)
CO2: 26 mmol/L (ref 22–32)
Calcium: 8 mg/dL — ABNORMAL LOW (ref 8.9–10.3)
Chloride: 101 mmol/L (ref 98–111)
Creatinine, Ser: 0.57 mg/dL (ref 0.44–1.00)
GFR calc Af Amer: >60 mL/min (ref >60)
GFR calc non Af Amer: >60 mL/min (ref >60)
Glucose, Bld: 113 mg/dL — ABNORMAL HIGH (ref 70–99)
Potassium: 3.4 mmol/L — ABNORMAL LOW (ref 3.5–5.1)
Sodium: 134 mmol/L — ABNORMAL LOW (ref 135–145)

## 2018-11-29 MED ORDER — OXYCODONE-ACETAMINOPHEN 5-325 MG PO TABS: 1.0000 | ORAL_TABLET | ORAL | 0 refills | Status: DC | PRN

## 2018-11-29 MED ORDER — IBUPROFEN 600 MG PO TABS: 600.0000 mg | ORAL_TABLET | Freq: Four times a day (QID) | ORAL | 1 refills | Status: DC | PRN

## 2018-11-29 NOTE — Discharge Instructions (Signed)
Hysterectomy Information  A hysterectomy is a surgery in which the uterus is removed. The fallopian tubes and ovaries may be removed (bilateral salpingo-oophorectomy) as well. This procedure may be done to treat various medical problems. After the procedure, a woman will no longer have menstrual periods nor will she be able to become pregnant (sterile). What are the reasons for a hysterectomy? There are many reasons why a woman might have this procedure. They include:  Persistent, abnormal vaginal bleeding.  Long-term (chronic) pelvic pain or infection.  Endometriosis. This is when the lining of the uterus (endometrium) starts to grow outside the uterus.  Adenomyosis. This is when the endometrium starts to grow in the muscle of the uterus.  Pelvic organ prolapse. This is a condition in which the uterus falls down into the vagina.  Noncancerous growths in the uterus (uterine fibroids) that cause symptoms.  The presence of precancerous cells.  Cervical or uterine cancer. What are the different types of hysterectomy? There are three different types of hysterectomy:  Supracervical hysterectomy. In this type, the top part of the uterus is removed, but not the cervix.  Total hysterectomy. In this type, the uterus and cervix are removed.  Radical hysterectomy. In this type, the uterus, the cervix, and the tissue that holds the uterus in place (parametrium) are removed. What are the different ways a hysterectomy can be performed? There are many different ways a hysterectomy can be performed, including:  Abdominal hysterectomy. In this type, an incision is made in the abdomen. The uterus is removed through this incision.  Vaginal hysterectomy. In this type, an incision is made in the vagina. The uterus is removed through this incision. There are no abdominal incisions.  Conventional laparoscopic hysterectomy. In this type, three or four small incisions are made in the abdomen. A thin,  lighted tube with a camera (laparoscope) is inserted into one of the incisions. Other tools are put through the other incisions. The uterus is cut into small pieces. The small pieces are removed through the incisions or through the vagina.  Laparoscopically assisted vaginal hysterectomy (LAVH). In this type, three or four small incisions are made in the abdomen. Part of the surgery is performed laparoscopically and the other part is done vaginally. The uterus is removed through the vagina.  Robot-assisted laparoscopic hysterectomy. In this type, a laparoscope and other tools are inserted into three or four small incisions in the abdomen. A computer-controlled device is used to give the surgeon a 3D image and to help control the surgical instruments. This allows for more precise movements of surgical instruments. The uterus is cut into small pieces and removed through the incisions or removed through the vagina. Discuss the options with your health care provider to determine which type is the right one for you. What are the risks? Generally, this is a safe procedure. However, problems may occur, including:  Bleeding and risk of blood transfusion. Tell your health care provider if you do not want to receive any blood products.  Blood clots in the legs or lung.  Infection.  Damage to other structures or organs.  Allergic reactions to medicines.  Changing to an abdominal hysterectomy from one of the other techniques. What to expect after a hysterectomy  You will be given pain medicine.  You may need to stay in the hospital for 1- 2 days to recover, depending on the type of hysterectomy you had.  Follow your health care provider's instructions about exercise, driving, and general activities. Ask your   health care provider what activities are safe for you.  You will need to have someone with you for the first 3-5 days after you go home.  You will need to follow up with your surgeon in 2-4  weeks after surgery to evaluate your progress.  If the ovaries are removed, you will have early menopause symptoms such as hot flashes, night sweats, and insomnia.  If you had a hysterectomy for a problem that was not cancer or not a condition that could lead to cancer, then you no longer need Pap tests. However, even if you no longer need a Pap test, a regular pelvic exam is a good idea to make sure no other problems are developing. Questions to ask your health care provider  Is a hysterectomy medically necessary? Do I have other treatment options for my condition?  What are my options for hysterectomy procedure?  What organs and tissues need to be removed?  What are the risks?  What are the benefits?  How long will I need to stay in the hospital after the procedure?  How long will I need to recover at home?  What symptoms can I expect after the procedure? Summary  A hysterectomy is a surgery in which the uterus is removed. The fallopian tubes and ovaries may be removed (bilateral salpingo-oophorectomy) as well.  This procedure may be done to treat various medical problems. After the procedure, a woman will no longer have menstrual periods nor will she be able to become pregnant.  Discuss the options with your health care provider to determine which type of hysterectomy is the right one for you. This information is not intended to replace advice given to you by your health care provider. Make sure you discuss any questions you have with your health care provider. Document Released: 10/18/2000 Document Revised: 04/06/2017 Document Reviewed: 05/31/2016 Elsevier Patient Education  2020 [employer]  

## 2018-11-29 NOTE — Discharge Summary (Signed)
Physician Discharge Summary  Patient ID: Jessica Murphy MRN: [id number] DOB/AGE: 07-Oct-1975 43 y.o.  Admit date: 11/28/2018 Discharge date: 11/29/2018  Admission Diagnoses: Scheduled hysterectomy for uterine fibroids  Discharge Diagnoses:  Active Problems:   S/P hysterectomy   Discharged Condition: good  Hospital Course: 43 y.o. female with known history of uterine fibroids and resulting AUB who has failed management with a Mirena IUD.  She underwent uncomplicated TLH, BS, cystoscopy and concurrent umbilical hernia repair by general surgery.  The patient did well postoperatively.  She had her foley removed and IV saline locked the evening of POD0.  She was ambulating, voiding, tolerating po at the time of discharge.  She remained hemodynamically stable and afebrile throughout admission.  Postoperative H&H and BUN/Cr stable.  Consults: None  Significant Diagnostic Studies: Results for orders placed or performed during the hospital encounter of 11/28/18 (from the past 48 hour(s))  Pregnancy, urine POC     Status: None   Collection Time: 11/28/18 11:36 AM  Result Value Ref Range   Preg Test, Ur NEGATIVE NEGATIVE    Comment:        THE SENSITIVITY OF THIS METHODOLOGY IS >24 mIU/mL   ABO/Rh     Status: None   Collection Time: 11/28/18 12:16 PM  Result Value Ref Range   ABO/RH(D)      O NEG Performed at [facility], [address] [place], [place] [address]   CBC     Status: Abnormal   Collection Time: 11/29/18  4:56 AM  Result Value Ref Range   WBC 11.7 (H) 4.0 - 10.5 K/uL   RBC 3.25 (L) 3.87 - 5.11 MIL/uL   Hemoglobin 10.8 (L) 12.0 - 15.0 g/dL   HCT 31.5 (L) 36.0 - 46.0 %   MCV 96.9 80.0 - 100.0 fL   MCH 33.2 26.0 - 34.0 pg   MCHC 34.3 30.0 - 36.0 g/dL   RDW 12.1 11.5 - 15.5 %   Platelets 203 150 - 400 K/uL   nRBC 0.0 0.0 - 0.2 %    Comment: Performed at [facility], [address] [place], [place] [address]  Basic metabolic panel     Status:  Abnormal   Collection Time: 11/29/18  4:56 AM  Result Value Ref Range   Sodium 134 (L) 135 - 145 mmol/L   Potassium 3.4 (L) 3.5 - 5.1 mmol/L   Chloride 101 98 - 111 mmol/L   CO2 26 22 - 32 mmol/L   Glucose, Bld 113 (H) 70 - 99 mg/dL   BUN 9 6 - 20 mg/dL   Creatinine, Ser 0.57 0.44 - 1.00 mg/dL   Calcium 8.0 (L) 8.9 - 10.3 mg/dL   GFR calc non Af Amer >60 >60 mL/min   GFR calc Af Amer >60 >60 mL/min   Anion gap 7 5 - 15    Comment: Performed at [facility], [address] [place], [place] [address]     Treatments: TLH, BS, cystoscopy and umbilical hernia repair  Discharge Exam: Blood pressure 108/73, pulse 67, temperature 98 F (36.7 C), temperature source Oral, resp. rate 16, height 5\' 4"  (1.626 m), weight 63.5 kg, last menstrual period 11/17/2018, SpO2 98 %. General appearance: alert, appears stated age and no distress Resp: no increased work of breathing GI: soft, non-tender, non-distended Incision/Wound:D/C/I trocar sites  Disposition: Discharge disposition: 01-Home or Self Care       Discharge Instructions    Call MD for:   Complete by: As directed  Heavy vaginal bleeding greater than 1 pad an hour   Call MD for:  difficulty breathing, headache or visual disturbances   Complete by: As directed    Call MD for:  extreme fatigue   Complete by: As directed    Call MD for:  hives   Complete by: As directed    Call MD for:  persistant dizziness or light-headedness   Complete by: As directed    Call MD for:  persistant nausea and vomiting   Complete by: As directed    Call MD for:  redness, tenderness, or signs of infection (pain, swelling, redness, odor or green/yellow discharge around incision site)   Complete by: As directed    Call MD for:  severe uncontrolled pain   Complete by: As directed    Call MD for:  temperature >100.4   Complete by: As directed    Diet general   Complete by: As directed    Discharge wound care:   Complete by: As  directed    You may apply a light dressing for minor discharge from the incision or to keep waistbands of clothing from rubbing.  You may also have been discharge with a clear dressing in which case this will be removed at your postoperative clinic visit.  You may shower, use soap on your incision.  Avoid baths or soaking the incision in the first 6 weeks following your surgery..   Driving restriction   Complete by: As directed    Avoid driving for at least 2 weeks or while taking prescription narcotics.   Lifting restrictions   Complete by: As directed    Weight restriction of 10lbs for 6 weeks.   Sexual acrtivity   Complete by: As directed    No intercourse, tampons, or anything vaginally for 6 weeks     Allergies as of 11/29/2018      Reactions   Guaifenesin Palpitations   Bactrim [sulfamethoxazole-trimethoprim]    Yeast infection and diarrhea   Erythromycin Nausea And Vomiting      Medication List    STOP taking these medications   aspirin-acetaminophen-caffeine 250-250-65 MG tablet Commonly known as: EXCEDRIN MIGRAINE   fluconazole 150 MG tablet Commonly known as: DIFLUCAN     TAKE these medications   clonazePAM 0.5 MG tablet Commonly known as: KLONOPIN Take 0.25 mg by mouth daily as needed for anxiety.   docusate sodium 100 MG capsule Commonly known as: COLACE Take 100 mg by mouth daily.   escitalopram 5 MG tablet Commonly known as: LEXAPRO Take 5 mg by mouth every morning.   ibuprofen 600 MG tablet Commonly known as: ADVIL Take 1 tablet (600 mg total) by mouth every 6 (six) hours as needed for moderate pain or cramping (pain). What changed:   medication strength  how much to take  reasons to take this   oxyCODONE-acetaminophen 5-325 MG tablet Commonly known as: PERCOCET/ROXICET Take 1-2 tablets by mouth every 4 (four) hours as needed for moderate pain.   Vitamin D 50 MCG (2000 UT) tablet Take 2,000 Units by mouth 3 (three) times a week.             Discharge Care Instructions  (From admission, onward)         Start     Ordered   11/29/18 0000  Discharge wound care:    Comments: You may apply a light dressing for minor discharge from the incision or to keep waistbands of clothing from rubbing.  You may also  have been discharge with a clear dressing in which case this will be removed at your postoperative clinic visit.  You may shower, use soap on your incision.  Avoid baths or soaking the incision in the first 6 weeks following your surgery.Marland Kitchen   11/29/18 3524         Follow-up Information    Malachy Mood, MD.   Specialty: Obstetrics and Gynecology Why: For wound re-check Contact information: [address] [place] [address] [phone]           Signed: Malachy Mood 11/29/2018, 8:07 AM

## 2018-11-29 NOTE — Op Note (Signed)
Preoperative Diagnosis: 1) 43 y.o. with uterine fibroid and abnormal uterine bleeding  Postoperative Diagnosis: 1) 43 y.o. with uterine fibroid and abnormal uterine bleeding   Operation Performed: Total laparoscopic hysterectomy, bilateral salpingectomy, and cystoscopy with umbilibal hernia repair by Dr. Dahlia Byes see separate operative note  Indication: uterine fibroid and abnormal uterine bleeding failing medical management with Mirena IUD  Surgeon: Malachy Mood, MD  Assistant: Dr. Barnett Applebaum, this surgery required a high level surgical assistant with none other readily available  Anesthesia: Choice  Preoperative Antibiotics: 2g Ancef  Estimated Blood Loss: 150 mL Drains or Tubes: none  Implants: none  Specimens Removed: Uterus, cervix, and bilateral fallopian tubes  Complications: none  Intraoperative Findings: Globally enlarged fibroid uterus approximately 16 week size.  Normal ovaries bilaterally.  Intact bladder with bilateral efflux of urine from both ureteral orifices.  The bladder mucosa was notable for several small Hunner's ulcers.    Patient Condition: stable  Procedure in Detail:  Patient was taken to the operating room where she was administered general anesthesia.  She was positioned in the dorsal lithotomy position utilizing Allen stirups, prepped and draped in the usual sterile fashion.  Prior to proceeding with procedure a time out was performed.  Attention was turned to the patient's pelvis.  An indwelling foley catheter was placed to decompress the patient's bladder.  An operative speculum was placed to allow visualization of the cervix.  The anterior lip of the cervix was grasped with a single tooth tenaculum, the IUD was removed using Bozman forceps.  A large V-care uterine manipulator was placed to allow manipulation of the uterus.  The operative speculum and single tooth tenaculum were then removed.  Attention was turned to the patient's abdomen.  The  umbilicus was infiltrated with 1% Sensorcaine, before making a stab incision using an 11 blade scalpel.  A 30mm Excel trocar was then used to gain direct entry into the peritoneal cavity utilizing the camera to visualize progress of the trocar during placement.  Once peritoneal entry had been achieved, insufflation was started and pneumoperitoneum established at a pressure of 80mmHg.    One left and one right lower quadrant site were then injected with 1% Sensorcaine and a stab incision was made using an 11 blade scalpel.  Two additional 2mm Excel trocars were placed through these incisions under direct visualization, the umbilical trocar was stepped up to a 28mm Excel trocar. General inspection of the abdomen revealed the above noted findings.   The right tube was identified and grasped at its fimbriated end.  The tube was transected from its attachments to the ovary and mesosalpinx using a 90mm Harmonic scalpel.  The utero ovarian ligament was identified ligated and transected using the Harmonic scalpel. The round ligament was then likewise ligated and transected.  The anterior leaf of the broad ligament was dissected down to the level of the internal cervical os and a bladder flap was started.  The posterior leaf of the broad ligament was dissected down to the utero-sacral ligament.  The uterine artery was skeletonized before being ligated and transected using the Harmonic scalpel with cephelad pressure applied to the V-care device to assure lateralization of the ureter.  A bite was then taken with Harmonic medial to transected portio of uterine artery to further lateralize the ureter and vessel off the V-care cup.  The patient left adnexal structures were then dissected in similar fashion.  The bladder flap was completed and the bladder mobilized off the V-care cup.  An  anterior colpotomy was scores and carried around in a clockwise fashion to free the specimen, which was then removed vaginally, requiring partial  vaginal uterine morecellation given the size of the specimen.  Inspection revealed all pedicles to be hemostatic before proceed with vaginal closure. The cuff was closed with an Endostitch using V-loc load in a running fashion.  The cuff was hemsotatic at the conclusion of closure without visible or palpable defects. Fibrillar was applied to all pedicles. All trocar sites were then dressed with surgical skin glue.  The 81mm port site at the umbilicus remained open for Dr. Corlis Leak portion of the case.  The indwelling foley catheter was removed.  Cystoscopy was performed noting and intact bladder dome as well as brisk efflux of urine from bother ureteral orifices.  The cystoscopy was removed and the indwelling foley catheter was replaced.  Sponge needle and instrument counts were correct time two.  The patient tolerated the procedure well and was taken to the recovery room in stable condition.

## 2018-11-29 NOTE — Plan of Care (Signed)
Vs stable; up ad lib now; voiding independently now; taking toradol and percocet PRN for pain control; main complaint is gas; pt does report "passing a little gas"; pt does have simethicone PRN for gas pains now

## 2018-11-30 ENCOUNTER — Observation Stay: Payer: 59 | Admitting: Certified Registered"

## 2018-11-30 ENCOUNTER — Observation Stay
Admission: EM | Admit: 2018-11-30 | Discharge: 2018-12-01 | Disposition: A | Discharge status: Home / Self Care | Payer: 59 | Attending: Obstetrics and Gynecology | Admitting: Obstetrics and Gynecology

## 2018-11-30 ENCOUNTER — Other Ambulatory Visit: Payer: Self-pay

## 2018-11-30 ENCOUNTER — Other Ambulatory Visit: Payer: Self-pay | Admitting: Obstetrics & Gynecology

## 2018-11-30 ENCOUNTER — Emergency Department: Payer: 59

## 2018-11-30 ENCOUNTER — Encounter
Admission: EM | Disposition: A | Discharge status: Home / Self Care | Payer: Self-pay | Source: Home / Self Care | Attending: Emergency Medicine

## 2018-11-30 ENCOUNTER — Encounter: Payer: Self-pay | Admitting: Emergency Medicine

## 2018-11-30 ENCOUNTER — Observation Stay: Payer: 59

## 2018-11-30 DIAGNOSIS — F419 Anxiety disorder, unspecified: Secondary | ICD-10-CM | POA: Diagnosis not present

## 2018-11-30 DIAGNOSIS — K6389 Other specified diseases of intestine: Secondary | ICD-10-CM | POA: Diagnosis not present

## 2018-11-30 DIAGNOSIS — Z87891 Personal history of nicotine dependence: Secondary | ICD-10-CM | POA: Insufficient documentation

## 2018-11-30 DIAGNOSIS — N3289 Other specified disorders of bladder: Secondary | ICD-10-CM | POA: Diagnosis not present

## 2018-11-30 DIAGNOSIS — G8918 Other acute postprocedural pain: Secondary | ICD-10-CM | POA: Diagnosis not present

## 2018-11-30 DIAGNOSIS — N9981 Other intraoperative complications of genitourinary system: Secondary | ICD-10-CM | POA: Diagnosis present

## 2018-11-30 DIAGNOSIS — N9971 Accidental puncture and laceration of a genitourinary system organ or structure during a genitourinary system procedure: Principal | ICD-10-CM | POA: Insufficient documentation

## 2018-11-30 DIAGNOSIS — Z90710 Acquired absence of both cervix and uterus: Secondary | ICD-10-CM | POA: Insufficient documentation

## 2018-11-30 DIAGNOSIS — R338 Other retention of urine: Secondary | ICD-10-CM | POA: Insufficient documentation

## 2018-11-30 DIAGNOSIS — S3720XA Unspecified injury of bladder, initial encounter: Secondary | ICD-10-CM | POA: Diagnosis not present

## 2018-11-30 DIAGNOSIS — R1030 Lower abdominal pain, unspecified: Secondary | ICD-10-CM | POA: Insufficient documentation

## 2018-11-30 DIAGNOSIS — Z79899 Other long term (current) drug therapy: Secondary | ICD-10-CM | POA: Insufficient documentation

## 2018-11-30 DIAGNOSIS — S3729XA Other injury of bladder, initial encounter: Secondary | ICD-10-CM | POA: Diagnosis not present

## 2018-11-30 HISTORY — PX: LAPAROSCOPY: SHX197

## 2018-11-30 LAB — COMPREHENSIVE METABOLIC PANEL
ALT: 12 U/L (ref 0–44)
AST: 17 U/L (ref 15–41)
Albumin: 4.3 g/dL (ref 3.5–5.0)
Alkaline Phosphatase: 53 U/L (ref 38–126)
Anion gap: 7 (ref 5–15)
BUN: 9 mg/dL (ref 6–20)
CO2: 27 mmol/L (ref 22–32)
Calcium: 8.8 mg/dL — ABNORMAL LOW (ref 8.9–10.3)
Chloride: 101 mmol/L (ref 98–111)
Creatinine, Ser: 0.85 mg/dL (ref 0.44–1.00)
GFR calc Af Amer: >60 mL/min (ref >60)
GFR calc non Af Amer: >60 mL/min (ref >60)
Glucose, Bld: 160 mg/dL — ABNORMAL HIGH (ref 70–99)
Potassium: 3.9 mmol/L (ref 3.5–5.1)
Sodium: 135 mmol/L (ref 135–145)
Total Bilirubin: 0.5 mg/dL (ref 0.3–1.2)
Total Protein: 6.9 g/dL (ref 6.5–8.1)

## 2018-11-30 LAB — URINALYSIS, COMPLETE (UACMP) WITH MICROSCOPIC
Bacteria, UA: NONE SEEN
RBC / HPF: >50 RBC/hpf — ABNORMAL HIGH (ref 0–5)
Specific Gravity, Urine: 1.024 (ref 1.005–1.030)
Squamous Epithelial / HPF: NONE SEEN (ref 0–5)
WBC, UA: >50 WBC/hpf — ABNORMAL HIGH (ref 0–5)

## 2018-11-30 LAB — CBC
HCT: 37.1 % (ref 36.0–46.0)
Hemoglobin: 12.3 g/dL (ref 12.0–15.0)
MCH: 32.7 pg (ref 26.0–34.0)
MCHC: 33.2 g/dL (ref 30.0–36.0)
MCV: 98.7 fL (ref 80.0–100.0)
Platelets: 236 10*3/uL (ref 150–400)
RBC: 3.76 MIL/uL — ABNORMAL LOW (ref 3.87–5.11)
RDW: 12.5 % (ref 11.5–15.5)
WBC: 10.6 10*3/uL — ABNORMAL HIGH (ref 4.0–10.5)
nRBC: 0 % (ref 0.0–0.2)

## 2018-11-30 SURGERY — LAPAROSCOPY OPERATIVE
Anesthesia: General

## 2018-11-30 MED ORDER — OXYCODONE HCL 5 MG/5ML PO SOLN: 5.0000 mg | Freq: Once | ORAL | Status: DC | PRN

## 2018-11-30 MED ORDER — PIPERACILLIN-TAZOBACTAM 3.375 G IVPB 30 MIN
3.3750 g | Freq: Once | INTRAVENOUS | Status: AC
  Administered 2018-11-30: 3.375 g via INTRAVENOUS
  Filled 2018-11-30: qty 50

## 2018-11-30 MED ORDER — FENTANYL CITRATE (PF) 100 MCG/2ML IJ SOLN: 25.0000 ug | INTRAMUSCULAR | Status: DC | PRN

## 2018-11-30 MED ORDER — LACTATED RINGERS IV SOLN
125.0000 mL/h | INTRAVENOUS | Status: DC
  Administered 2018-11-30: 12:00:00 via INTRAVENOUS
  Administered 2018-11-30 (×2): 125 mL/h via INTRAVENOUS

## 2018-11-30 MED ORDER — MIDAZOLAM HCL 2 MG/2ML IJ SOLN
INTRAMUSCULAR | Status: DC | PRN
  Administered 2018-11-30: 0.5 mg via INTRAVENOUS
  Administered 2018-11-30: 1.5 mg via INTRAVENOUS

## 2018-11-30 MED ORDER — DEXAMETHASONE SODIUM PHOSPHATE 10 MG/ML IJ SOLN
INTRAMUSCULAR | Status: DC | PRN
  Administered 2018-11-30: 10 mg via INTRAVENOUS

## 2018-11-30 MED ORDER — PROPOFOL 10 MG/ML IV BOLUS
INTRAVENOUS | Status: AC
  Filled 2018-11-30: qty 20

## 2018-11-30 MED ORDER — SIMETHICONE 80 MG PO CHEW
80.0000 mg | CHEWABLE_TABLET | Freq: Four times a day (QID) | ORAL | Status: DC | PRN
  Administered 2018-11-30 – 2018-12-01 (×3): 80 mg via ORAL
  Filled 2018-11-30 (×3): qty 1

## 2018-11-30 MED ORDER — SUCCINYLCHOLINE CHLORIDE 20 MG/ML IJ SOLN
INTRAMUSCULAR | Status: DC | PRN
  Administered 2018-11-30: 100 mg via INTRAVENOUS

## 2018-11-30 MED ORDER — HYDROMORPHONE HCL 1 MG/ML IJ SOLN
0.5000 mg | Freq: Once | INTRAMUSCULAR | Status: AC
  Administered 2018-11-30: 0.5 mg via INTRAVENOUS
  Filled 2018-11-30: qty 1

## 2018-11-30 MED ORDER — METHYLENE BLUE 0.5 % INJ SOLN
INTRAVENOUS | Status: AC
  Filled 2018-11-30: qty 10

## 2018-11-30 MED ORDER — FENTANYL CITRATE (PF) 100 MCG/2ML IJ SOLN
INTRAMUSCULAR | Status: DC | PRN
  Administered 2018-11-30 (×2): 50 ug via INTRAVENOUS
  Administered 2018-11-30: 25 ug via INTRAVENOUS

## 2018-11-30 MED ORDER — ONDANSETRON HCL 4 MG/2ML IJ SOLN
INTRAMUSCULAR | Status: DC | PRN
  Administered 2018-11-30: 4 mg via INTRAVENOUS

## 2018-11-30 MED ORDER — FENTANYL CITRATE (PF) 100 MCG/2ML IJ SOLN
50.0000 ug | Freq: Once | INTRAMUSCULAR | Status: AC
  Administered 2018-11-30: 50 ug via INTRAVENOUS
  Filled 2018-11-30: qty 2

## 2018-11-30 MED ORDER — CEFAZOLIN SODIUM-DEXTROSE 2-3 GM-%(50ML) IV SOLR
INTRAVENOUS | Status: DC | PRN
  Administered 2018-11-30: 2 g via INTRAVENOUS

## 2018-11-30 MED ORDER — LORAZEPAM 2 MG/ML IJ SOLN
0.5000 mg | Freq: Once | INTRAMUSCULAR | Status: AC
  Administered 2018-11-30: 0.5 mg via INTRAVENOUS
  Filled 2018-11-30: qty 1

## 2018-11-30 MED ORDER — ONDANSETRON HCL 4 MG/2ML IJ SOLN
INTRAMUSCULAR | Status: AC
  Filled 2018-11-30: qty 2

## 2018-11-30 MED ORDER — GLYCOPYRROLATE 0.2 MG/ML IJ SOLN
INTRAMUSCULAR | Status: DC | PRN
  Administered 2018-11-30: 0.1 mg via INTRAVENOUS

## 2018-11-30 MED ORDER — GLYCOPYRROLATE 0.2 MG/ML IJ SOLN
INTRAMUSCULAR | Status: AC
  Filled 2018-11-30: qty 2

## 2018-11-30 MED ORDER — PROPOFOL 10 MG/ML IV BOLUS
INTRAVENOUS | Status: DC | PRN
  Administered 2018-11-30: 120 mg via INTRAVENOUS
  Administered 2018-11-30: 30 mg via INTRAVENOUS

## 2018-11-30 MED ORDER — ACETAMINOPHEN 325 MG PO TABS: 650.0000 mg | ORAL_TABLET | ORAL | Status: DC | PRN

## 2018-11-30 MED ORDER — SODIUM CHLORIDE 0.9 % IV BOLUS
1000.0000 mL | Freq: Once | INTRAVENOUS | Status: AC
  Administered 2018-11-30: 1000 mL via INTRAVENOUS

## 2018-11-30 MED ORDER — ROCURONIUM BROMIDE 100 MG/10ML IV SOLN
INTRAVENOUS | Status: DC | PRN
  Administered 2018-11-30: 10 mg via INTRAVENOUS
  Administered 2018-11-30: 20 mg via INTRAVENOUS

## 2018-11-30 MED ORDER — OXYCODONE HCL 5 MG PO TABS: 5.0000 mg | ORAL_TABLET | Freq: Once | ORAL | Status: DC | PRN

## 2018-11-30 MED ORDER — IOHEXOL 300 MG/ML  SOLN
100.0000 mL | Freq: Once | INTRAMUSCULAR | Status: AC | PRN
  Administered 2018-11-30: 100 mL via INTRAVENOUS

## 2018-11-30 MED ORDER — BUPIVACAINE HCL (PF) 0.5 % IJ SOLN
INTRAMUSCULAR | Status: AC
  Filled 2018-11-30: qty 30

## 2018-11-30 MED ORDER — OXYCODONE-ACETAMINOPHEN 5-325 MG PO TABS
1.0000 | ORAL_TABLET | ORAL | Status: DC | PRN
  Administered 2018-11-30 – 2018-12-01 (×5): 1 via ORAL
  Filled 2018-11-30 (×5): qty 1

## 2018-11-30 MED ORDER — LACTATED RINGERS IV SOLN: INTRAVENOUS | Status: DC

## 2018-11-30 MED ORDER — NITROFURANTOIN MONOHYD MACRO 100 MG PO CAPS: 100.0000 mg | ORAL_CAPSULE | Freq: Every day | ORAL | 0 refills | Status: DC

## 2018-11-30 MED ORDER — SUGAMMADEX SODIUM 200 MG/2ML IV SOLN
INTRAVENOUS | Status: DC | PRN
  Administered 2018-11-30: 126 mg via INTRAVENOUS

## 2018-11-30 MED ORDER — ESCITALOPRAM OXALATE 10 MG PO TABS: 5.0000 mg | ORAL_TABLET | ORAL | Status: DC

## 2018-11-30 MED ORDER — LIDOCAINE HCL (CARDIAC) PF 100 MG/5ML IV SOSY
PREFILLED_SYRINGE | INTRAVENOUS | Status: DC | PRN
  Administered 2018-11-30: 60 mg via INTRAVENOUS

## 2018-11-30 MED ORDER — EPHEDRINE SULFATE 50 MG/ML IJ SOLN
INTRAMUSCULAR | Status: DC | PRN
  Administered 2018-11-30: 10 mg via INTRAVENOUS

## 2018-11-30 MED ORDER — ONDANSETRON 4 MG PO TBDP
4.0000 mg | ORAL_TABLET | Freq: Once | ORAL | Status: AC | PRN
  Administered 2018-11-30: 4 mg via ORAL
  Filled 2018-11-30: qty 1

## 2018-11-30 MED ORDER — FENTANYL CITRATE (PF) 100 MCG/2ML IJ SOLN
INTRAMUSCULAR | Status: AC
  Filled 2018-11-30: qty 2

## 2018-11-30 MED ORDER — MIDAZOLAM HCL 2 MG/2ML IJ SOLN
INTRAMUSCULAR | Status: AC
  Filled 2018-11-30: qty 2

## 2018-11-30 MED ORDER — OXYCODONE-ACETAMINOPHEN 5-325 MG PO TABS
2.0000 | ORAL_TABLET | Freq: Once | ORAL | Status: AC
  Administered 2018-11-30: 2 via ORAL
  Filled 2018-11-30: qty 2

## 2018-11-30 MED ORDER — BUPIVACAINE HCL (PF) 0.5 % IJ SOLN
INTRAMUSCULAR | Status: DC | PRN
  Administered 2018-11-30: 14 mL

## 2018-11-30 MED ORDER — DEXAMETHASONE SODIUM PHOSPHATE 10 MG/ML IJ SOLN
INTRAMUSCULAR | Status: AC
  Filled 2018-11-30: qty 1

## 2018-11-30 MED ORDER — MORPHINE SULFATE (PF) 2 MG/ML IV SOLN: 1.0000 mg | INTRAVENOUS | Status: DC | PRN

## 2018-11-30 MED ORDER — EPHEDRINE SULFATE 50 MG/ML IJ SOLN
INTRAMUSCULAR | Status: AC
  Filled 2018-11-30: qty 1

## 2018-11-30 MED ORDER — PHENYLEPHRINE HCL (PRESSORS) 10 MG/ML IV SOLN
INTRAVENOUS | Status: DC | PRN
  Administered 2018-11-30: 50 ug via INTRAVENOUS
  Administered 2018-11-30: 100 ug via INTRAVENOUS

## 2018-11-30 MED ORDER — KETOROLAC TROMETHAMINE 30 MG/ML IJ SOLN
30.0000 mg | Freq: Four times a day (QID) | INTRAMUSCULAR | Status: DC
  Administered 2018-11-30 – 2018-12-01 (×4): 30 mg via INTRAVENOUS
  Filled 2018-11-30 (×4): qty 1

## 2018-11-30 MED ORDER — LACTATED RINGERS IV SOLN
INTRAVENOUS | Status: DC | PRN
  Administered 2018-11-30: 12:00:00 via INTRAVENOUS

## 2018-11-30 MED ORDER — HYDROMORPHONE HCL 1 MG/ML IJ SOLN
0.5000 mg | INTRAMUSCULAR | Status: DC | PRN
  Administered 2018-11-30 (×2): 0.5 mg via INTRAVENOUS
  Filled 2018-11-30 (×2): qty 1

## 2018-11-30 MED ORDER — LIDOCAINE HCL (PF) 2 % IJ SOLN
INTRAMUSCULAR | Status: AC
  Filled 2018-11-30: qty 10

## 2018-11-30 MED ORDER — PHENYLEPHRINE HCL (PRESSORS) 10 MG/ML IV SOLN
INTRAVENOUS | Status: AC
  Filled 2018-11-30: qty 1

## 2018-11-30 MED ORDER — ACETAMINOPHEN 650 MG RE SUPP: 650.0000 mg | RECTAL | Status: DC | PRN

## 2018-11-30 SURGICAL SUPPLY — 49 items
ADH SKN CLS APL DERMABOND .7 (GAUZE/BANDAGES/DRESSINGS) ×1
APL PRP STRL LF DISP 70% ISPRP (MISCELLANEOUS) ×1
BAG SPEC RTRVL LRG 6X4 10 (ENDOMECHANICALS)
BLADE SURG SZ11 CARB STEEL (BLADE) ×2 IMPLANT
CANISTER SUCT 1200ML W/VALVE (MISCELLANEOUS) ×1 IMPLANT
CANISTER SUCT 3000ML PPV (MISCELLANEOUS) ×1 IMPLANT
CATH ROBINSON RED A/P 16FR (CATHETERS) ×1 IMPLANT
CATH URETL 5X70 OPEN END (CATHETERS) ×1 IMPLANT
CHLORAPREP W/TINT 26 (MISCELLANEOUS) ×2 IMPLANT
COVER WAND RF STERILE (DRAPES) ×2 IMPLANT
DERMABOND ADVANCED (GAUZE/BANDAGES/DRESSINGS) ×1
DERMABOND ADVANCED .7 DNX12 (GAUZE/BANDAGES/DRESSINGS) ×1 IMPLANT
DRSG TELFA 4X3 1S NADH ST (GAUZE/BANDAGES/DRESSINGS) IMPLANT
ENDOSTITCH 0 SINGLE 48 (SUTURE) ×3 IMPLANT
GLOVE BIO SURGEON STRL SZ8 (GLOVE) ×5 IMPLANT
GLOVE INDICATOR 8.0 STRL GRN (GLOVE) ×3 IMPLANT
GOWN STRL REUS W/ TWL LRG LVL3 (GOWN DISPOSABLE) ×1 IMPLANT
GOWN STRL REUS W/ TWL XL LVL3 (GOWN DISPOSABLE) ×1 IMPLANT
GOWN STRL REUS W/TWL LRG LVL3 (GOWN DISPOSABLE) ×2
GOWN STRL REUS W/TWL XL LVL3 (GOWN DISPOSABLE) ×4
IRRIGATION STRYKERFLOW (MISCELLANEOUS) IMPLANT
IRRIGATOR STRYKERFLOW (MISCELLANEOUS) ×2
IV LACTATED RINGERS 1000ML (IV SOLUTION) IMPLANT
KIT PINK PAD W/HEAD ARE REST (MISCELLANEOUS) ×2
KIT PINK PAD W/HEAD ARM REST (MISCELLANEOUS) ×1 IMPLANT
LABEL OR SOLS (LABEL) ×2 IMPLANT
NEEDLE VERESS 14GA 120MM (NEEDLE) ×2 IMPLANT
NEPTUNE MANIFOLD (MISCELLANEOUS) ×1 IMPLANT
NS IRRIG 500ML POUR BTL (IV SOLUTION) ×2 IMPLANT
PACK CYSTO AR (MISCELLANEOUS) ×1 IMPLANT
PACK GYN LAPAROSCOPIC (MISCELLANEOUS) ×2 IMPLANT
PAD PREP 24X41 OB/GYN DISP (PERSONAL CARE ITEMS) ×2 IMPLANT
POUCH SPECIMEN RETRIEVAL 10MM (ENDOMECHANICALS) IMPLANT
SCISSORS METZENBAUM CVD 33 (INSTRUMENTS) ×2 IMPLANT
SET CYSTO W/LG BORE CLAMP LF (SET/KITS/TRAYS/PACK) ×1 IMPLANT
SET TUBE SMOKE EVAC HIGH FLOW (TUBING) ×2 IMPLANT
SHEARS HARMONIC ACE PLUS 36CM (ENDOMECHANICALS) IMPLANT
SLEEVE ENDOPATH XCEL 5M (ENDOMECHANICALS) IMPLANT
SOL .9 NS 3000ML IRR  AL (IV SOLUTION) ×1
SOL .9 NS 3000ML IRR AL (IV SOLUTION) ×1
SOL .9 NS 3000ML IRR UROMATIC (IV SOLUTION) IMPLANT
SPONGE GAUZE 2X2 8PLY STRL LF (GAUZE/BANDAGES/DRESSINGS) IMPLANT
STRAP SAFETY 5IN WIDE (MISCELLANEOUS) ×2 IMPLANT
SUT VIC AB 2-0 UR6 27 (SUTURE) ×1 IMPLANT
SUT VIC AB 4-0 PS2 18 (SUTURE) ×1 IMPLANT
SYR 10ML LL (SYRINGE) ×2 IMPLANT
TROCAR ENDO BLADELESS 11MM (ENDOMECHANICALS) IMPLANT
TROCAR XCEL NON-BLD 5MMX100MML (ENDOMECHANICALS) ×2 IMPLANT
WATER STERILE IRR 1000ML POUR (IV SOLUTION) ×1 IMPLANT

## 2018-11-30 NOTE — Discharge Instructions (Signed)
Indwelling Urinary Catheter Care, Adult °An indwelling urinary catheter is a thin tube that is put into your bladder. The tube helps to drain pee (urine) out of your body. The tube goes in through your urethra. Your urethra is where pee comes out of your body. Your pee will come out through the catheter, then it will go into a bag (drainage bag). °Take good care of your catheter so it will work well. °How to wear your catheter and bag °Supplies needed °· Sticky tape (adhesive tape) or a leg strap. °· Alcohol wipe or soap and water (if you use tape). °· A clean towel (if you use tape). °· Large overnight bag. °· Smaller bag (leg bag). °Wearing your catheter °Attach your catheter to your leg with tape or a leg strap. °· Make sure the catheter is not pulled tight. °· If a leg strap gets wet, take it off and put on a dry strap. °· If you use tape to hold the bag on your leg: °1. Use an alcohol wipe or soap and water to wash your skin where the tape made it sticky before. °2. Use a clean towel to pat-dry that skin. °3. Use new tape to make the bag stay on your leg. °Wearing your bags °You should have been given a large overnight bag. °· You may wear the overnight bag in the day or night. °· Always have the overnight bag lower than your bladder.  Do not let the bag touch the floor. °· Before you go to sleep, put a clean plastic bag in a wastebasket. Then hang the overnight bag inside the wastebasket. °You should also have a smaller leg bag that fits under your clothes. °· Always wear the leg bag below your knee. °· Do not wear your leg bag at night. °How to care for your skin and catheter °Supplies needed °· A clean washcloth. °· Water and mild soap. °· A clean towel. °Caring for your skin and catheter ° °  ° °· Clean the skin around your catheter every day: °? Wash your hands with soap and water. °? Wet a clean washcloth in warm water and mild soap. °? Clean the skin around your urethra. °? If you are female: °? Gently  spread the folds of skin around your vagina (labia). °? With the washcloth in your other hand, wipe the inner side of your labia on each side. Wipe from front to back. °? If you are female: °? Pull back any skin that covers the end of your penis (foreskin). °? With the washcloth in your other hand, wipe your penis in small circles. Start wiping at the tip of your penis, then move away from the catheter. °? Move the foreskin back in place, if needed. °? With your free hand, hold the catheter close to where it goes into your body. °? Keep holding the catheter during cleaning so it does not get pulled out. °? With the washcloth in your other hand, clean the catheter. °? Only wipe downward on the catheter. °? Do not wipe upward toward your body. Doing this may push germs into your urethra and cause infection. °? Use a clean towel to pat-dry the catheter and the skin around it. Make sure to wipe off all soap. °? Wash your hands with soap and water. °· Shower every day. Do not take baths. °· Do not use cream, ointment, or lotion on the area where the catheter goes into your body, unless your doctor tells you   to. °· Do not use powders, sprays, or lotions on your genital area. °· Check your skin around the catheter every day for signs of infection. Check for: °? Redness, swelling, or pain. °? Fluid or blood. °? Warmth. °? Pus or a bad smell. °How to empty the bag °Supplies needed °· Rubbing alcohol. °· Gauze pad or cotton ball. °· Tape or a leg strap. °Emptying the bag °Pour the pee out of your bag when it is ?-½ full, or at least 2-3 times a day. Do this for your overnight bag and your leg bag. °1. Wash your hands with soap and water. °2. Separate (detach) the bag from your leg. °3. Hold the bag over the toilet or a clean pail. Keep the bag lower than your hips and bladder. This is so the pee (urine) does not go back into the tube. °4. Open the pour spout. It is at the bottom of the bag. °5. Empty the pee into the toilet or  pail. Do not let the pour spout touch any surface. °6. Put rubbing alcohol on a gauze pad or cotton ball. °7. Use the gauze pad or cotton ball to clean the pour spout. °8. Close the pour spout. °9. Attach the bag to your leg with tape or a leg strap. °10. Wash your hands with soap and water. °Follow instructions for cleaning the drainage bag: °· From the product maker. °· As told by your doctor. °How to change the bag °Supplies needed °· Alcohol wipes. °· A clean bag. °· Tape or a leg strap. °Changing the bag °Replace your bag when it starts to leak, smell bad, or look dirty. °1. Wash your hands with soap and water. °2. Separate the dirty bag from your leg. °3. Pinch the catheter with your fingers so that pee does not spill out. °4. Separate the catheter tube from the bag tube where these tubes connect (at the connection valve). Do not let the tubes touch any surface. °5. Clean the end of the catheter tube with an alcohol wipe. Use a different alcohol wipe to clean the end of the bag tube. °6. Connect the catheter tube to the tube of the clean bag. °7. Attach the clean bag to your leg with tape or a leg strap. Do not make the bag tight on your leg. °8. Wash your hands with soap and water. °General rules ° °· Never pull on your catheter. Never try to take it out. Doing that can hurt you. °· Always wash your hands before and after you touch your catheter or bag. Use a mild, fragrance-free soap. If you do not have soap and water, use hand sanitizer. °· Always make sure there are no twists or bends (kinks) in the catheter tube. °· Always make sure there are no leaks in the catheter or bag. °· Drink enough fluid to keep your pee pale yellow. °· Do not take baths, swim, or use a hot tub. °· If you are female, wipe from front to back after you poop (have a bowel movement). °Contact a doctor if: °· Your pee is cloudy. °· Your pee smells worse than usual. °· Your catheter gets clogged. °· Your catheter leaks. °· Your bladder  feels full. °Get help right away if: °· You have redness, swelling, or pain where the catheter goes into your body. °· You have fluid, blood, pus, or a bad smell coming from the area where the catheter goes into your body. °· Your skin feels warm where   the catheter goes into your body. °· You have a fever. °· You have pain in your: °? Belly (abdomen). °? Legs. °? Lower back. °? Bladder. °· You see blood in the catheter. °· Your pee is pink or red. °· You feel sick to your stomach (nauseous). °· You throw up (vomit). °· You have chills. °· Your pee is not draining into the bag. °· Your catheter gets pulled out. °Summary °· An indwelling urinary catheter is a thin tube that is placed into the bladder to help drain pee (urine) out of the body. °· The catheter is placed into the part of the body that drains pee from the bladder (urethra). °· Taking good care of your catheter will keep it working properly and help prevent problems. °· Always wash your hands before and after touching your catheter or bag. °· Never pull on your catheter or try to take it out. °This information is not intended to replace advice given to you by your health care provider. Make sure you discuss any questions you have with your health care provider. °Document Released: 08/19/2012 Document Revised: 08/16/2018 Document Reviewed: 12/08/2016 °Elsevier Patient Education © 2020 [employer] ° °

## 2018-11-30 NOTE — Op Note (Signed)
  Operative Note   11/30/2018  PRE-OP DIAGNOSIS: Cystotomy in post operative period, Pain    POST-OP DIAGNOSIS: same   PROCEDURE: Procedure(s): LAPAROSCOPY OPERATIVE,cystoscopy,bladder repair,retrograde pyelogram   SURGEON: Barnett Applebaum, MD, FACOG  ANESTHESIA: General   ESTIMATED BLOOD LOSS: Min  COMPLICATIONS: none  DISPOSITION: PACU - hemodynamically stable.  CONDITION: stable  FINDINGS: Laparoscopic survey of the abdomen (as well as cystoscopy of bladder) revealed midline bladder perforation <1cm; not near ureters or other vital organs. Vaginal cuff closure area intact.  Some filmy adhesions in lower pelvis but not occlusive to bladder repair vicinity.  PROCEDURE IN DETAIL: The patient was taken to the OR where anesthesia was administed. The patient was positioned in dorsal lithotomy in the [place]. The patient was then examined under anesthesia with the above noted findings. The patient was prepped and draped in the normal sterile fashion.  Cystoscopy performed by urology Dr Junious Silk, see note.  Attention was turned to the patient's abdomen where a 5 mm skin incision was made in the LUQ [place] point (as she had umbilical hernia repair two days ago and did not want to disrupt this repair), after injection of local anesthesia. The Veress step needle was carefully introduced into the peritoneal cavity with placement confirmed using the hanging drop technique.  Pneumoperitoneum was obtained. The 5 mm port was then placed under direct visualization with the operative laparoscope.  Trendelenburg positioning.  Additional 5 mm trocar was then placed in the LLQ lateral to the inferior epigastric blood vessels under direct visualization with the laparoscope, and the same on the RLQ with an 11 mm trocar.  Instrumentation to visualize complete pelvic anatomy performed.    Using the Endostitch device, the mucosa of the bladder first and then the muscularis and peritoneal layers were  closed in an interrupted fashion using a 0 Polysorb suture.  Three total sutures placed.  The bladder was then back-filled with methyline blue solution with no leakage noted (200 mL used).  The RLQ larger fascial incision is closed with a 1 Vicryl suture using a fascia closure device to assist closure.  Instruments and trocars removed, gas expelled, and skin closed with skin adhesive glue.  Instrument, needle, and sponge counts correct x2 at the conclusion of the case.  Pt goes to recovery room in stable condition.  Barnett Applebaum, MD, [facility], [facility] 11/30/2018  1:32 PM

## 2018-11-30 NOTE — Progress Notes (Signed)
Day of Surgery Procedure(s) (LRB): LAPAROSCOPY OPERATIVE,cystoscopy,bladder repair,retrograde pyelogram (N/A)  Subjective: Patient reports incisional pain, tolerating PO and min pain; no bleeding.    Objective: I have reviewed patient's vital signs, intake and output, medications and labs.  Abd: Min T, ND Incision: clean, dry and intact Extr: no calf T, no edema  Assessment: s/p Procedure(s): LAPAROSCOPY OPERATIVE,cystoscopy,bladder repair,retrograde pyelogram (N/A): stable  Plan: Advance diet Encourage ambulation Advance to PO medication Continue foley due to s/p bladder repair; needs to stya in place for 10 post operative days and have cystogram prior to removal    LOS: 0 days    Jessica Murphy 11/30/2018, 7:56 PM

## 2018-11-30 NOTE — ED Provider Notes (Signed)
[facility] Emergency Department Provider Note   ____________________________________________   First MD Initiated Contact with Patient 11/30/18 0216     (approximate)  I have reviewed the triage vital signs and the nursing notes.   HISTORY  Chief Complaint Post-op Problem    HPI Jessica Murphy is a 43 y.o. female who presents to the ED from home with a chief complaint of abdominal discomfort.  Patient had laparoscopic hysterectomy yesterday.  Tonight she has noticed increasing pain every time she tries to urinate with urgency and dysuria and states she stopped passing gas at approximately 4 PM.  Denies fever, cough, chest pain, shortness of breath, nausea, vomiting.  Spoke with the on-call OB/GYN Dr. Glennon Mac who referred her to the ED for evaluation.       Past Medical History:  Diagnosis Date  . Blood type O-   . Complication of anesthesia    bp dropped with 1st spinal  . Menorrhagia   . Spontaneous abortion    END OF 2011/2012, 2017  . Umbilical hernia   . Uterine fibroid     Patient Active Problem List   Diagnosis Date Noted  . S/P hysterectomy 11/28/2018  . Family history of colon cancer 12/04/2017  . Uterine fibroid   . Umbilical hernia   . Menorrhagia   . Blood type O-     Past Surgical History:  Procedure Laterality Date  . CESAREAN SECTION     X3  . HYSTEROSCOPY  03/25/2009   HYSTEROSCOPIC IUD REMOVAL  . LAPAROSCOPIC HYSTERECTOMY N/A 11/28/2018   Procedure: HYSTERECTOMY TOTAL LAPAROSCOPIC;  Surgeon: Malachy Mood, MD;  Location: [facility] ORS;  Service: Gynecology;  Laterality: N/A;  . UMBILICAL HERNIA REPAIR N/A 11/28/2018   Procedure: HERNIA REPAIR UMBILICAL ADULT;  Surgeon: Jules Husbands, MD;  Location: [facility] ORS;  Service: General;  Laterality: N/A;  . WISDOM TOOTH EXTRACTION      Prior to Admission medications   Medication Sig Start Date End Date Taking? Authorizing Provider  Cholecalciferol (VITAMIN D) 50 MCG (2000  UT) tablet Take 2,000 Units by mouth 3 (three) times a week.    [provider]  clonazePAM (KLONOPIN) 0.5 MG tablet Take 0.25 mg by mouth daily as needed for anxiety. 09/19/18   [provider]  docusate sodium (COLACE) 100 MG capsule Take 100 mg by mouth daily.    [provider]  escitalopram (LEXAPRO) 5 MG tablet Take 5 mg by mouth every morning.     [provider]  ibuprofen (ADVIL) 600 MG tablet Take 1 tablet (600 mg total) by mouth every 6 (six) hours as needed for moderate pain or cramping (pain). 11/29/18   Malachy Mood, MD  oxyCODONE-acetaminophen (PERCOCET/ROXICET) 5-325 MG tablet Take 1-2 tablets by mouth every 4 (four) hours as needed for moderate pain. 11/29/18   Malachy Mood, MD    Allergies Guaifenesin, Bactrim [sulfamethoxazole-trimethoprim], and Erythromycin  Family History  Problem Relation Age of Onset  . Congestive Heart Failure Mother   . Hypertension Mother   . Thyroid disease Mother        HYPO  . Lupus Mother        ERYTHEMATOSUS  . Rheum arthritis Mother   . Sjogren's syndrome Mother   . Colitis Mother        collagenous  . Alcohol abuse Father        DECEASED 08/06/09  . Cancer Maternal Grandmother 58       COLON  . Heart disease  Maternal Grandmother   . Thyroid disease Maternal Grandmother   . Heart disease Maternal Grandfather   . Diabetes Paternal Grandfather        TYPE 2  . Heart disease Paternal Grandfather   . Cancer Cousin 44       BREAST  . Alcohol abuse Cousin 43  . Diabetes Son        TYPE 1  . Thyroid disease Maternal Aunt     Social History Social History   Tobacco Use  . Smoking status: Former Smoker    Types: Cigarettes    Quit date: 11/18/2000    Years since quitting: 18.0  . Smokeless tobacco: Never Used  . Tobacco comment: more socailly  Substance Use Topics  . Alcohol use: Yes    Comment: occ  . Drug use: No    Review of Systems  Constitutional: No fever/chills Eyes: No  visual changes. ENT: No sore throat. Cardiovascular: Denies chest pain. Respiratory: Denies shortness of breath. Gastrointestinal: Positive for abdominal pain.  No nausea, no vomiting.  No diarrhea.  No constipation. Genitourinary: Positive for urgency and dysuria. Musculoskeletal: Negative for back pain. Skin: Negative for rash. Neurological: Negative for headaches, focal weakness or numbness.   ____________________________________________   PHYSICAL EXAM:  VITAL SIGNS: ED Triage Vitals  Enc Vitals Group     BP 11/30/18 0023 (!) 141/91     Pulse Rate 11/30/18 0023 100     Resp 11/30/18 0023 18     Temp 11/30/18 0023 97.8 F (36.6 C)     Temp Source 11/30/18 0023 Oral     SpO2 11/30/18 0023 100 %     Weight 11/30/18 0024 138 lb 14.2 oz (63 kg)     Height 11/30/18 0024 5\' 4"  (1.626 m)     Head Circumference --      Peak Flow --      Pain Score 11/30/18 0017 10     Pain Loc --      Pain Edu? --      Excl. in [place]? --     Constitutional: Alert and oriented. Well appearing and in mild to moderate acute distress. Eyes: Conjunctivae are normal. PERRL. EOMI. Head: Atraumatic. Nose: No congestion/rhinnorhea. Mouth/Throat: Mucous membranes are moist.  Oropharynx non-erythematous. Neck: No stridor.   Cardiovascular: Normal rate, regular rhythm. Grossly normal heart sounds.  Good peripheral circulation. Respiratory: Normal respiratory effort.  No retractions. Lungs CTAB. Gastrointestinal: Incision sites clean/dry/intact.  Soft with mild diffuse tenderness to palpation, maximally in suprapubic region. No distention. No abdominal bruits. No CVA tenderness. Musculoskeletal: No lower extremity tenderness nor edema.  No joint effusions. Neurologic:  Normal speech and language. No gross focal neurologic deficits are appreciated. No gait instability. Skin:  Skin is warm, dry and intact. No rash noted. Psychiatric: Mood and affect are normal. Speech and behavior are normal.   ____________________________________________   LABS (all labs ordered are listed, but only abnormal results are displayed)  Labs Reviewed  COMPREHENSIVE METABOLIC PANEL - Abnormal; Notable for the following components:      Result Value   Glucose, Bld 160 (*)    Calcium 8.8 (*)    All other components within normal limits  CBC - Abnormal; Notable for the following components:   WBC 10.6 (*)    RBC 3.76 (*)    All other components within normal limits  URINALYSIS, COMPLETE (UACMP) WITH MICROSCOPIC - Abnormal; Notable for the following components:   Color, Urine RED (*)  APPearance CLOUDY (*)    Glucose, UA   (*)    Value: TEST NOT REPORTED DUE TO COLOR INTERFERENCE OF URINE PIGMENT   Hgb urine dipstick   (*)    Value: TEST NOT REPORTED DUE TO COLOR INTERFERENCE OF URINE PIGMENT   Bilirubin Urine   (*)    Value: TEST NOT REPORTED DUE TO COLOR INTERFERENCE OF URINE PIGMENT   Ketones, ur   (*)    Value: TEST NOT REPORTED DUE TO COLOR INTERFERENCE OF URINE PIGMENT   Protein, ur   (*)    Value: TEST NOT REPORTED DUE TO COLOR INTERFERENCE OF URINE PIGMENT   Nitrite   (*)    Value: TEST NOT REPORTED DUE TO COLOR INTERFERENCE OF URINE PIGMENT   Leukocytes,Ua   (*)    Value: TEST NOT REPORTED DUE TO COLOR INTERFERENCE OF URINE PIGMENT   RBC / HPF >50 (*)    WBC, UA >50 (*)    All other components within normal limits   ____________________________________________  EKG  None ____________________________________________  RADIOLOGY  ED MD interpretation: X-rays demonstrate moderate stool burden, gaseous distention's of loops without obvious SBO; CT scan discussed with Dr. Weber Cooks from radiology which demonstrates tip of Foley catheter and retention balloon extending above the level of the superior bladder wall and is residing in the lower peritoneal cavity surrounded by multiple small bowel loops.  Official radiology report(s): Ct Abdomen Pelvis W Contrast  Addendum Date:  11/30/2018   ADDENDUM REPORT: 11/30/2018 05:30 ADDENDUM: Upon further review, delayed images were obtained all the way through the low anatomic pelvis (not typically obtained on routine CT the abdomen and pelvis with contrast), and these clearly demonstrate the presence of intraperitoneal iodinated contrast presumably leaking from the defect in the urinary bladder. Electronically Signed   By: Vinnie Langton M.D.   On: 11/30/2018 05:30   Result Date: 11/30/2018 CLINICAL DATA:  43 year old female with history of laparoscopic hysterectomy yesterday presenting with increasing pain in the lower abdomen and pelvis and low back. Increased vaginal bleeding. EXAM: CT ABDOMEN AND PELVIS WITH CONTRAST TECHNIQUE: Multidetector CT imaging of the abdomen and pelvis was performed using the standard protocol following bolus administration of intravenous contrast. CONTRAST:  127mL OMNIPAQUE IOHEXOL 300 MG/ML  SOLN COMPARISON:  No priors. FINDINGS: Lower chest: Unremarkable. Hepatobiliary: Innumerable small low-attenuation lesions scattered throughout the liver, too small to characterize, but statistically likely to represent tiny cysts and/or biliary hamartomas. Gallbladder is normal in appearance. Pancreas: No pancreatic mass. No pancreatic ductal dilatation. No pancreatic or peripancreatic fluid collections or inflammatory changes. Spleen: Unremarkable. Adrenals/Urinary Tract: Bilateral kidneys and adrenal glands are normal. No hydroureteronephrosis. There is a Foley catheter extending through the urinary bladder. However, the balloon tip of the catheter appears to extend beyond the superior wall of the bladder into the lower aspect of the peritoneal cavity. This is best demonstrated on sagittal image 49 of series 6, where the tip of the catheter is surrounded by multiple adjacent small bowel loops. Stomach/Bowel: Normal appearance of the stomach. No pathologic dilatation of small bowel or colon. Normal appendix.  Vascular/Lymphatic: No significant atherosclerotic disease noted in the abdominal or pelvic vasculature, aneurysm or dissection. No lymphadenopathy noted in the abdomen or pelvis. Reproductive: Status post hysterectomy. Vaginal lumen of appears mildly distended by fluid which is relatively low attenuation. Ovaries are unremarkable in appearance. Other: Small amount of pneumoperitoneum, expected given the patient's recent laparoscopic surgery. Small volume of ascites most evident in the low anatomic pelvis.  Musculoskeletal: There are no aggressive appearing lytic or blastic lesions noted in the visualized portions of the skeleton. IMPRESSION: 1. Postoperative changes of recent laparoscopic hysterectomy. Unexpectedly, however, the tip of the Foley catheter and the retention balloon extend above the level of the superior bladder wall and reside in the lower aspect of the peritoneal cavity surrounded by multiple small bowel loops. Surgical consultation is recommended. Critical Value/emergent results were called by telephone at the time of interpretation on 11/30/2018 at 4:54 am to Dr. Lurline Hare, who verbally acknowledged these results. Electronically Signed: By: Vinnie Langton M.D. On: 11/30/2018 04:56   Dg Abd Acute W/chest  Result Date: 11/30/2018 CLINICAL DATA:  Recent surgery.  Not passing gas. EXAM: DG ABDOMEN ACUTE W/ 1V CHEST COMPARISON:  None. FINDINGS: There may be atelectasis at the left lung base. There is no pneumothorax. No large pleural effusion. The heart size is normal. There is gaseous distention of multiple loops of small bowel and colon scattered throughout the abdomen. There is a moderate amount of stool in the ascending colon. There is subcutaneous gas along the patient's right flank. There is no definite pneumatosis or free air. IMPRESSION: 1. Probable atelectasis at the left lung base. 2. Gaseous distention of loops of small bowel and colon without definite radiographic evidence for  high-grade small bowel obstruction. 3. Moderate amount of stool in the ascending colon. 4. Subcutaneous gas along the patient's right flank, presumably related to the patient's recent surgical intervention. Electronically Signed   By: Constance Holster M.D.   On: 11/30/2018 02:51    ____________________________________________   PROCEDURES  Procedure(s) performed (including Critical Care):  Procedures  CRITICAL CARE Performed by: Paulette Blanch   Total critical care time: 45 minutes  Critical care time was exclusive of separately billable procedures and treating other patients.  Critical care was necessary to treat or prevent imminent or life-threatening deterioration.  Critical care was time spent personally by me on the following activities: development of treatment plan with patient and/or surrogate as well as nursing, discussions with consultants, evaluation of patient's response to treatment, examination of patient, obtaining history from patient or surrogate, ordering and performing treatments and interventions, ordering and review of laboratory studies, ordering and review of radiographic studies, pulse oximetry and re-evaluation of patient's condition. ____________________________________________   INITIAL IMPRESSION / ASSESSMENT AND PLAN / ED COURSE  As part of my medical decision making, I reviewed the following data within the [place] notes reviewed and incorporated, Labs reviewed, Old chart reviewed, Radiograph reviewed and Notes from prior ED visits     Jessica Murphy was evaluated in Emergency Department on 11/30/2018 for the symptoms described in the history of present illness. She was evaluated in the context of the global COVID-19 pandemic, which necessitated consideration that the patient might be at risk for infection with the SARS-CoV-2 virus that causes COVID-19. Institutional protocols and algorithms that pertain to the evaluation of  patients at risk for COVID-19 are in a state of rapid change based on information released by regulatory bodies including the CDC and federal and state organizations. These policies and algorithms were followed during the patient's care in the ED.   43 year old female status post laparoscopic hysterectomy yesterday presenting with urgency, dysuria, abdominal pain and not passing gas.  Differential diagnosis includes but is not limited to acute urinary retention, constipation, UTI, ileus, SBO, etc.  Laboratory results unremarkable.  Bladder scan reveals 336 mL in bladder.  Patient amenable for Foley.  Will infuse IV fluids, IV Ativan for bladder spasms, obtain three-way abdomen; will reassess.   Clinical Course as of Nov 29 545  Sat Nov 30, 2018  0341 Foley placed with gross hematuria noted. Updated Dr. Glennon Mac; concern for bladder injury. Will speak with radiologist about appropriate imaging study to order to evaluate possible bladder injury.  Of note, patient did feel relief after Foley insertion.   [JS]  [phone] Spoke with radiologist who recommends CT abdomen/pelvis with IV contrast only with a delayed scan through the pelvis to include the bladder.   [JS]  [id number] Discussed patient CT scan with radiologist.  Spoke with Dr. Junious Silk from urology who is reviewing patient's CT scan and will give me a call back.  Advised not to remove Foley catheter in the meantime.  Will cover with broad-spectrum IV antibiotic.   [JS]  [phone] Spoke with Dr. Glennon Mac from GYN who will communicate directly with urology to plan surgical repair.  Patient has been kept up-to-date.  IV Dilaudid given for pain.   [JS]    Clinical Course User Index [JS] Paulette Blanch, MD     ____________________________________________   FINAL CLINICAL IMPRESSION(S) / ED DIAGNOSES  Final diagnoses:  Bladder rupture  Lower abdominal pain     ED Discharge Orders    None       Note:  This document was prepared using Dragon voice  recognition software and may include unintentional dictation errors.   Paulette Blanch, MD 11/30/18 [phone]

## 2018-11-30 NOTE — H&P (Signed)
GYNECOLOGY HISTORY AND PHYSICAL NOTE   Jessica Murphy [id number] 11/30/2018 6:32 AM    Chief Complaint:   Jessica Murphy is a 43 y.o. [phone] premenopausal female who presents for acute worsening of abdominal pain POD#1 s/p total laparoscopic hysterectomy, bilateral salpingectomy, cystoscopy, and umbilical hernia repair.     History of Present Ilness:   The patient underwent a TLH/BS/Cysto on 11/28/2018 for abnormal uterine bleeding and fibroid uterus.  Her operative and early post-operative course were unremarkable.  She was discharged home yesterday after meeting all discharge goals.  Yesterday later in the day when she attempted to void she was getting only minimal urine out on voiding.  Her pain became acutely worse at this point and even percocet did not make the pain significantly better.  Additionally, she noted an increase in vaginal bleeding, which had been nearly absent up to this point.  She attempted to void again later, but had the same result.  She called the practice after-hours line and was contacted by me.  After discussing her symptoms, I recommended she be evaluated in the ER.  She denies fevers, chills, nausea, vomiting. She has had trouble passing flatus, though she has done so at least once.  She has not had a bowel movement. She denies issues with her incisions. When she arrived to the Emergency Room a bladder scan showed > 300 mL urine in her bladder with her having an inability to void. When the catheter was placed there was return of bright red blood in the catheter tubing.  She had not had hematuria prior to this event.    Past Medical History:  Diagnosis Date  . Blood type O-   . Complication of anesthesia    bp dropped with 1st spinal  . Menorrhagia   . Spontaneous abortion    END OF 2011/2012, 2017  . Umbilical hernia   . Uterine fibroid    Past Surgical History:  Procedure Laterality Date  . CESAREAN SECTION     X3  . HYSTEROSCOPY  03/25/2009    HYSTEROSCOPIC IUD REMOVAL  . LAPAROSCOPIC HYSTERECTOMY N/A 11/28/2018   Procedure: HYSTERECTOMY TOTAL LAPAROSCOPIC;  Surgeon: Malachy Mood, MD;  Location: [facility] ORS;  Service: Gynecology;  Laterality: N/A;  . UMBILICAL HERNIA REPAIR N/A 11/28/2018   Procedure: HERNIA REPAIR UMBILICAL ADULT;  Surgeon: Jules Husbands, MD;  Location: [facility] ORS;  Service: General;  Laterality: N/A;  . WISDOM TOOTH EXTRACTION     Allergies  Allergen Reactions  . Guaifenesin Palpitations  . Bactrim [Sulfamethoxazole-Trimethoprim]     Yeast infection and diarrhea  . Erythromycin Nausea And Vomiting   Prior to Admission medications   Medication Sig Start Date End Date Taking? Authorizing Provider  Cholecalciferol (VITAMIN D) 50 MCG (2000 UT) tablet Take 2,000 Units by mouth 3 (three) times a week.   Yes [provider]  CRANBERRY CONCENTRATE PO Take 2 capsules by mouth daily.   Yes [provider]  docusate sodium (COLACE) 100 MG capsule Take 100 mg by mouth daily.   Yes [provider]  escitalopram (LEXAPRO) 5 MG tablet Take 5 mg by mouth every morning.    Yes [provider]  ibuprofen (ADVIL) 600 MG tablet Take 1 tablet (600 mg total) by mouth every 6 (six) hours as needed for moderate pain or cramping (pain). 11/29/18  Yes Malachy Mood, MD  oxyCODONE-acetaminophen (PERCOCET/ROXICET) 5-325 MG tablet Take 1-2 tablets by mouth every 4 (four) hours as needed for moderate pain. 11/29/18  Yes Malachy Mood, MD  clonazePAM (KLONOPIN) 0.5 MG tablet Take 0.25 mg by mouth daily as needed for anxiety. 09/19/18   [provider]    Social History:  She  reports that she quit smoking about 18 years ago. Her smoking use included cigarettes. She has never used smokeless tobacco. She reports current alcohol use. She reports that she does not use drugs.  Family History:  family history includes Alcohol abuse in her father; Alcohol abuse (age of onset: 31) in her cousin;  Cancer (age of onset: 86) in her cousin; Cancer (age of onset: 70) in her maternal grandmother; Colitis in her mother; Congestive Heart Failure in her mother; Diabetes in her paternal grandfather and son; Heart disease in her maternal grandfather, maternal grandmother, and paternal grandfather; Hypertension in her mother; Lupus in her mother; Rheum arthritis in her mother; Sjogren's syndrome in her mother; Thyroid disease in her maternal aunt, maternal grandmother, and mother.   Review of Systems:   Review of Systems  Constitutional: Negative.  Negative for chills and fever.  HENT: Negative.   Eyes: Negative.   Respiratory: Negative.   Cardiovascular: Negative.  Negative for chest pain.  Gastrointestinal: Positive for abdominal pain. Negative for blood in stool, constipation, diarrhea, melena, nausea and vomiting.  Genitourinary: Positive for dysuria and urgency. Negative for flank pain, frequency and hematuria.  Musculoskeletal: Negative.   Skin: Negative.   Neurological: Negative.   Psychiatric/Behavioral: Negative.      Objective    BP 104/67 (BP Location: Left Arm)   Pulse 81   Temp 97.8 F (36.6 C) (Oral)   Resp 17   Ht 5\' 4"  (1.626 m)   Wt 63 kg   LMP 11/17/2018 (Exact Date)   SpO2 95%   BMI 23.84 kg/m  Physical Exam  Physical Exam Constitutional:      General: She is not in acute distress.    Appearance: Normal appearance. She is well-developed.  Genitourinary:     Genitourinary Comments: deferred  HENT:     Head: Normocephalic and atraumatic.  Eyes:     General: No scleral icterus.    Conjunctiva/sclera: Conjunctivae normal.  Neck:     Musculoskeletal: Normal range of motion and neck supple.  Cardiovascular:     Rate and Rhythm: Normal rate and regular rhythm.     Heart sounds: No murmur. No friction rub. No gallop.   Pulmonary:     Effort: Pulmonary effort is normal. No respiratory distress.     Breath sounds: Normal breath sounds. No wheezing or rales.   Abdominal:     General: Bowel sounds are normal. There is no distension.     Palpations: Abdomen is soft. There is no mass.     Tenderness: There is abdominal tenderness (mild ttp). There is no guarding or rebound.     Comments: Incisions clean, dry, and intact  Musculoskeletal: Normal range of motion.  Neurological:     General: No focal deficit present.     Mental Status: She is alert and oriented to person, place, and time.     Cranial Nerves: No cranial nerve deficit.  Skin:    General: Skin is warm and dry.     Findings: No erythema.  Psychiatric:        Mood and Affect: Mood normal.        Behavior: Behavior normal.        Judgment: Judgment normal.    Laboratory Results:   Lab Results  Component Value Date  WBC 10.6 (H) 11/30/2018   RBC 3.76 (L) 11/30/2018   HGB 12.3 11/30/2018   HCT 37.1 11/30/2018   PLT 236 11/30/2018   NA 135 11/30/2018   K 3.9 11/30/2018   CREATININE 0.85 11/30/2018   Lab Results  Component Value Date   PREGTESTUR NEGATIVE 11/28/2018    Imaging Results:  Ct Abdomen Pelvis W Contrast  Addendum Date: 11/30/2018   ADDENDUM REPORT: 11/30/2018 05:30 ADDENDUM: Upon further review, delayed images were obtained all the way through the low anatomic pelvis (not typically obtained on routine CT the abdomen and pelvis with contrast), and these clearly demonstrate the presence of intraperitoneal iodinated contrast presumably leaking from the defect in the urinary bladder. Electronically Signed   By: Vinnie Langton M.D.   On: 11/30/2018 05:30   Result Date: 11/30/2018 CLINICAL DATA:  43 year old female with history of laparoscopic hysterectomy yesterday presenting with increasing pain in the lower abdomen and pelvis and low back. Increased vaginal bleeding. EXAM: CT ABDOMEN AND PELVIS WITH CONTRAST TECHNIQUE: Multidetector CT imaging of the abdomen and pelvis was performed using the standard protocol following bolus administration of intravenous contrast.  CONTRAST:  166mL OMNIPAQUE IOHEXOL 300 MG/ML  SOLN COMPARISON:  No priors. FINDINGS: Lower chest: Unremarkable. Hepatobiliary: Innumerable small low-attenuation lesions scattered throughout the liver, too small to characterize, but statistically likely to represent tiny cysts and/or biliary hamartomas. Gallbladder is normal in appearance. Pancreas: No pancreatic mass. No pancreatic ductal dilatation. No pancreatic or peripancreatic fluid collections or inflammatory changes. Spleen: Unremarkable. Adrenals/Urinary Tract: Bilateral kidneys and adrenal glands are normal. No hydroureteronephrosis. There is a Foley catheter extending through the urinary bladder. However, the balloon tip of the catheter appears to extend beyond the superior wall of the bladder into the lower aspect of the peritoneal cavity. This is best demonstrated on sagittal image 49 of series 6, where the tip of the catheter is surrounded by multiple adjacent small bowel loops. Stomach/Bowel: Normal appearance of the stomach. No pathologic dilatation of small bowel or colon. Normal appendix. Vascular/Lymphatic: No significant atherosclerotic disease noted in the abdominal or pelvic vasculature, aneurysm or dissection. No lymphadenopathy noted in the abdomen or pelvis. Reproductive: Status post hysterectomy. Vaginal lumen of appears mildly distended by fluid which is relatively low attenuation. Ovaries are unremarkable in appearance. Other: Small amount of pneumoperitoneum, expected given the patient's recent laparoscopic surgery. Small volume of ascites most evident in the low anatomic pelvis. Musculoskeletal: There are no aggressive appearing lytic or blastic lesions noted in the visualized portions of the skeleton. IMPRESSION: 1. Postoperative changes of recent laparoscopic hysterectomy. Unexpectedly, however, the tip of the Foley catheter and the retention balloon extend above the level of the superior bladder wall and reside in the lower aspect of  the peritoneal cavity surrounded by multiple small bowel loops. Surgical consultation is recommended. Critical Value/emergent results were called by telephone at the time of interpretation on 11/30/2018 at 4:54 am to Dr. Lurline Hare, who verbally acknowledged these results. Electronically Signed: By: Vinnie Langton M.D. On: 11/30/2018 04:56   Dg Abd Acute W/chest  Result Date: 11/30/2018 CLINICAL DATA:  Recent surgery.  Not passing gas. EXAM: DG ABDOMEN ACUTE W/ 1V CHEST COMPARISON:  None. FINDINGS: There may be atelectasis at the left lung base. There is no pneumothorax. No large pleural effusion. The heart size is normal. There is gaseous distention of multiple loops of small bowel and colon scattered throughout the abdomen. There is a moderate amount of stool in the ascending colon. There is  subcutaneous gas along the patient's right flank. There is no definite pneumatosis or free air. IMPRESSION: 1. Probable atelectasis at the left lung base. 2. Gaseous distention of loops of small bowel and colon without definite radiographic evidence for high-grade small bowel obstruction. 3. Moderate amount of stool in the ascending colon. 4. Subcutaneous gas along the patient's right flank, presumably related to the patient's recent surgical intervention. Electronically Signed   By: Constance Holster M.D.   On: 11/30/2018 02:51      Assessment & Plan   Jessica Murphy is a 43 y.o. [id number] POD#2 s/p TLH/BS/Cysto/Umbilical hernia repair with bladder injury.  1.  Will admit for repair. Discussed case with Urologist on call. He recommends laparoscopic repair of opening in bladder and leaving catheter in bladder as per the standard protocol for intra-operative bladder injury.  He states that a simple, figure-of-eight stitch should suffice.  He is available at [facility] today for an intra-operative consult. 2.  Make patient NPO.  3. Per Urology recommendations, the catheter is still extending outside the bladder into the  intraperitoneal cavity to aid in finding the bladder defect when attempting to repair. If she has significant discomfort in her abdomen, the catheter can be pulled back into the bladder to drain the bladder more effectively. Backfilling of the bladder intra-operatively to find the defect would then be necessary.  4. The situation has been explained to the patient and all her questions were answered.  5. I would anticipate she would be able to go home after the repair is accomplished.   Prentice Docker, MD 11/30/2018 6:32 AM

## 2018-11-30 NOTE — Progress Notes (Signed)
Patient to OR for surgery. Consent form signed and in the chart. Report given to OR charge nurse. Patient off unit in bed pushed by orderly.

## 2018-11-30 NOTE — Progress Notes (Signed)
Patient back to unit from OR. Pain 2/10. Scheduled toradol given. Foley catheter in place, urine currently blue. Bowel sounds hypoactive. Cardiac, neurologic, and respiratory assessment WDL. RN to continue to monitor.

## 2018-11-30 NOTE — ED Notes (Signed)
MD Jackson at bedside.

## 2018-11-30 NOTE — Progress Notes (Signed)
Patient having very mild gas pain. Patient states she feels gas but has not been able to pass it yet. Ambulation encouraged. Patient ambulated in hall with NT. Tolerated well. RN to call MD for gas medicine if patient unable to pass. Pain 4/10. Pain controlled with PO percocet and IV toradol. Patient tolerated clear liquids upon arrival back to unit. No N/V at this time. Patient states she is ready for regular food. Regular diet placed per AAT order. Urine output adequate. RN to continue to monitor.

## 2018-11-30 NOTE — Anesthesia Postprocedure Evaluation (Signed)
Anesthesia Post Note  Patient: Jessica Murphy  Procedure(s) Performed: LAPAROSCOPY OPERATIVE,cystoscopy,bladder repair,retrograde pyelogram (N/A )  Patient location during evaluation: PACU Anesthesia Type: General Level of consciousness: awake and alert Pain management: pain level controlled Vital Signs Assessment: post-procedure vital signs reviewed and stable Respiratory status: spontaneous breathing, nonlabored ventilation, respiratory function stable and patient connected to nasal cannula oxygen Cardiovascular status: blood pressure returned to baseline and stable Postop Assessment: no apparent nausea or vomiting Anesthetic complications: no     Last Vitals:  Vitals:   11/30/18 1345 11/30/18 1400  BP: 111/77 100/60  Pulse: (!) 102 86  Resp: 19 17  Temp:    SpO2: 97% 97%    Last Pain:  Vitals:   11/30/18 1400  TempSrc:   PainSc: 0-No pain                 Precious Haws Adisyn Ruscitti

## 2018-11-30 NOTE — Transfer of Care (Signed)
Immediate Anesthesia Transfer of Care Note  Patient: Jessica Murphy  Procedure(s) Performed: LAPAROSCOPY OPERATIVE,cystoscopy,bladder repair,retrograde pyelogram (N/A )  Patient Location: PACU  Anesthesia Type:General  Level of Consciousness: awake, alert , oriented and patient cooperative  Airway & Oxygen Therapy: Patient Spontanous Breathing  Post-op Assessment: Report given to RN and Post -op Vital signs reviewed and stable  Post vital signs: Reviewed and stable  Last Vitals:  Vitals Value Taken Time  BP 117/68 11/30/18 1330  Temp 36.6 C 11/30/18 1330  Pulse 96 11/30/18 1335  Resp 13 11/30/18 1335  SpO2 96 % 11/30/18 1335  Vitals shown include unvalidated device data.  Last Pain:  Vitals:   11/30/18 1330  TempSrc:   PainSc: 0-No pain         Complications: No apparent anesthesia complications

## 2018-11-30 NOTE — Anesthesia Post-op Follow-up Note (Signed)
Anesthesia QCDR form completed.        

## 2018-11-30 NOTE — ED Notes (Signed)
Bladder scan 325ml

## 2018-11-30 NOTE — Progress Notes (Signed)
Urologist, Dr. Junious Silk, in department to assess patient. Verbal report given by RN. 74fr foley catheter removed by Dr. Junious Silk and 92fr foley catheter placed. 7109ml urine drained and emptied from catheter. Patient states "that feels a lot better." Patient also states she has less pressure in bladder now. Pain reassessment 6/10 after IV Dilaudid. RN to continue to monitor.

## 2018-11-30 NOTE — Anesthesia Procedure Notes (Signed)
Procedure Name: Intubation Date/Time: 11/30/2018 12:17 PM Performed by: Adalberto Ill, CRNA Pre-anesthesia Checklist: Patient identified, Emergency Drugs available, Suction available, Patient being monitored and Timeout performed Patient Re-evaluated:Patient Re-evaluated prior to induction Oxygen Delivery Method: Circle system utilized Preoxygenation: Pre-oxygenation with 100% oxygen Induction Type: IV induction and Rapid sequence Laryngoscope Size: Benney and 2 Grade View: Grade I Tube type: Oral Tube size: 7.0 mm Number of attempts: 1 Airway Equipment and Method: Stylet Placement Confirmation: ETT inserted through vocal cords under direct vision,  positive ETCO2 and breath sounds checked- equal and bilateral Secured at: 19 cm Tube secured with: Tape Dental Injury: Teeth and Oropharynx as per pre-operative assessment

## 2018-11-30 NOTE — Interval H&P Note (Signed)
History and Physical Interval Note:  11/30/2018 11:26 AM  Jessica Murphy  has presented today for surgery, with the diagnosis of bladder injury during surgery.  The various methods of treatment have been discussed with the patient and family. After consideration of risks, benefits and other options for treatment, the patient has consented to  Procedure(s): LAPAROSCOPY OPERATIVE (N/A) with Bladder Repair as a surgical intervention.  The patient's history has been reviewed, patient examined, no change in status, stable for surgery.  I have reviewed the patient's chart and labs.  Questions were answered to the patient's satisfaction.     Hoyt Koch

## 2018-11-30 NOTE — Op Note (Signed)
Preoperative diagnosis: Intraperitoneal bladder rupture Postoperative diagnosis: Same  Procedure: Cystoscopy with bilateral retrograde pyelogram  Surgeon: Junious Silk  Anesthesia: General  Indication for procedure: Ms. Teagarden is a 43 year old female postop day 2 from lap hysterectomy who presented to emergency department last night with abdominal pain.  Foley catheter was placed and CT revealed Foley catheter in the peritoneal cavity with intraperitoneal extravasation of contrast from the bladder.  I wanted to perform retrograde pyelogram as the mid to distal ureters  were not filled with contrast at the time of the CT delays and with the contrast in the peritoneal cavity.  Findings: On cystoscopy the urethra was unremarkable, trigone and ureteral orifices were in the normal orthotopic position with clear efflux bilaterally.  There was a small 1 cm cystotomy in the posterior bladder wall about a third of the way up but well up from the trigone.  The remainder of the bladder appeared unremarkable without evidence of trauma or mucosal lesion.  There was no stone or foreign body in the bladder.  Left retrograde pyelogram-this outlined a single ureter single collecting system unit without filling defect, stricture or dilation.  Right retrograde pyelogram-this outlined a single ureter single collecting system unit without filling defect, stricture or dilation.  Description of procedure: After consent was obtained she was brought to the operating room.  She is placed in lithotomy position and prepped and draped in the usual sterile fashion.  A timeout was performed to confirm the patient and procedure.  The cystoscope was advanced per urethra and the bladder carefully inspected.  The cystotomy was visualized.  There was no bleeding.  The left ureteral orifice was cannulated with 5 [place] open-ended catheter and retrograde injection of contrast was performed.  The right ureteral orifice was cannulated with a  5 [place] open-ended catheter and retrograde injection of contrast was performed.  The scope was removed and I placed a 16 French Foley in left to gravity drainage.  The patient was turned over to Dr. Kenton Kingfisher for his portion of the procedure.

## 2018-11-30 NOTE — Anesthesia Preprocedure Evaluation (Signed)
Anesthesia Evaluation  Patient identified by MRN, date of birth, ID band Patient awake    Reviewed: Allergy & Precautions, H&P , NPO status , Patient's Chart, lab work & pertinent test results  History of Anesthesia Complications (+) history of anesthetic complications  Airway Mallampati: II  TM Distance: >3 FB Neck ROM: full    Dental  (+) Chipped   Pulmonary neg shortness of breath, former smoker,           Cardiovascular Exercise Tolerance: Good (-) angina(-) Past MI and (-) DOE negative cardio ROS       Neuro/Psych negative neurological ROS  negative psych ROS   GI/Hepatic negative GI ROS, Neg liver ROS, neg GERD  ,  Endo/Other  negative endocrine ROS  Renal/GU      Musculoskeletal   Abdominal   Peds  Hematology negative hematology ROS (+)   Anesthesia Other Findings Past Medical History: No date: Blood type O- No date: Complication of anesthesia     Comment:  bp dropped with 1st spinal No date: Menorrhagia No date: Spontaneous abortion     Comment:  END OF 2011/2012, 1610 No date: Umbilical hernia No date: Uterine fibroid  Past Surgical History: No date: CESAREAN SECTION     Comment:  X3 03/25/2009: HYSTEROSCOPY     Comment:  HYSTEROSCOPIC IUD REMOVAL 11/28/2018: LAPAROSCOPIC HYSTERECTOMY; N/A     Comment:  Procedure: HYSTERECTOMY TOTAL LAPAROSCOPIC;  Surgeon:               Malachy Mood, MD;  Location: [facility] ORS;  Service:               Gynecology;  Laterality: N/A; 9/60/4540: UMBILICAL HERNIA REPAIR; N/A     Comment:  Procedure: HERNIA REPAIR UMBILICAL ADULT;  Surgeon:               Jules Husbands, MD;  Location: [facility] ORS;  Service:               General;  Laterality: N/A; No date: WISDOM TOOTH EXTRACTION  BMI    Body Mass Index: 23.84 kg/m      Reproductive/Obstetrics negative OB ROS                             Anesthesia Physical Anesthesia Plan  ASA:  II  Anesthesia Plan: General ETT   Post-op Pain Management:    Induction: Intravenous  PONV Risk Score and Plan: Ondansetron, Dexamethasone, Midazolam and Treatment may vary due to age or medical condition  Airway Management Planned: Oral ETT  Additional Equipment:   Intra-op Plan:   Post-operative Plan: Extubation in OR  Informed Consent: I have reviewed the patients History and Physical, chart, labs and discussed the procedure including the risks, benefits and alternatives for the proposed anesthesia with the patient or authorized representative who has indicated his/her understanding and acceptance.     Dental Advisory Given  Plan Discussed with: Anesthesiologist, CRNA and Surgeon  Anesthesia Plan Comments: (Patient consented for risks of anesthesia including but not limited to:  - adverse reactions to medications - damage to teeth, lips or other oral mucosa - sore throat or hoarseness - Damage to heart, brain, lungs or loss of life  Patient voiced understanding.)        Anesthesia Quick Evaluation

## 2018-11-30 NOTE — ED Notes (Signed)
Pt to CT at this time.

## 2018-11-30 NOTE — ED Notes (Signed)
Pt to xray at this time.

## 2018-11-30 NOTE — Progress Notes (Signed)
Patient arrived to unit at [facility]. ED report received from [place], [place]. Vitals stable and WDL. Patient pain 4/10 in her bladder. Knee length SCDs placed by RN. 622ml blood emptied from catheter. Patient bladder scanned (as foley is currently through her bladder) and patient states last bladder scan was at 0100; >623ml urine in bladder. MD, Dr. Kenton Kingfisher, deflated catheter balloon to drain urine. Attempt unsuccessful. Foley catheter removed and 14 fr catheter replaced by Dr. Kenton Kingfisher, however, no urine draining from new catheter at this time. Patient in a lot of pain during this procedure (8/10). IV dilaudid given. Urology consult placed by MD and to assess patient. RN to continue to monitor.

## 2018-11-30 NOTE — ED Notes (Signed)
ED TO INPATIENT HANDOFF REPORT  ED Nurse Name and Phone #: [phone] Name/Age/Gender Jessica Murphy 43 y.o. female Room/Bed: ED01A/ED01A  Code Status   Code Status: Full Code  Home/SNF/Other Home Patient oriented to: self, place, time and situation Is this baseline? Yes   Triage Complete: Triage complete  Chief Complaint post op problem  Triage Note Patient c/o 10 of 10 pain post hysterectomy yesterday. Patient is no longer able to pass gas. Patient called OBGYN Glennon Mac) and was advised to come to ED.   Pt ambulatory to triage with noted discomfort. Pt reports she had laparoscopic hysterectomy yesterday. Tonight she has had increasing pain in her lower abd, pelvic region and low back. Pt reports she was not able to lay down to sleep. Pt called and spoke with Dr Glennon Mac who advised her to be evaluated in the ED. Pt also reports increased vaginal bleeding.    Allergies Allergies  Allergen Reactions  . Guaifenesin Palpitations  . Bactrim [Sulfamethoxazole-Trimethoprim]     Yeast infection and diarrhea  . Erythromycin Nausea And Vomiting    Level of Care/Admitting Diagnosis ED Disposition    ED Disposition Condition [facility] Area: [place] [[id number]]  Level of Care: Postpartum [19]  Covid Evaluation: Confirmed COVID Negative  Diagnosis: Intraoperative bladder injury [433295]  Admitting Physician: Will Bonnet [[id number]]  Attending Physician: Will Bonnet [[id number]]  PT Class (Do Not Modify): Observation [104]  PT Acc Code (Do Not Modify): Observation [10022]       B Medical/Surgery History Past Medical History:  Diagnosis Date  . Blood type O-   . Complication of anesthesia    bp dropped with 1st spinal  . Menorrhagia   . Spontaneous abortion    END OF 2011/2012, 2017  . Umbilical hernia   . Uterine fibroid    Past Surgical History:  Procedure Laterality Date  . CESAREAN SECTION     X3  . HYSTEROSCOPY   03/25/2009   HYSTEROSCOPIC IUD REMOVAL  . LAPAROSCOPIC HYSTERECTOMY N/A 11/28/2018   Procedure: HYSTERECTOMY TOTAL LAPAROSCOPIC;  Surgeon: Malachy Mood, MD;  Location: [facility] ORS;  Service: Gynecology;  Laterality: N/A;  . UMBILICAL HERNIA REPAIR N/A 11/28/2018   Procedure: HERNIA REPAIR UMBILICAL ADULT;  Surgeon: Jules Husbands, MD;  Location: [facility] ORS;  Service: General;  Laterality: N/A;  . WISDOM TOOTH EXTRACTION       A IV Location/Drains/Wounds Patient Lines/Drains/Airways Status   Active Line/Drains/Airways    Name:   Placement date:   Placement time:   Site:   Days:   Peripheral IV 11/30/18 Right Hand   11/30/18    0320    Hand   less than 1   Urethral Catheter Reylene Stauder L RN Latex 14 Fr.   11/30/18    0245    Latex   less than 1   Incision (Closed) 60/10/93 Umbilicus   23/55/73    2202     2   Incision (Closed) 11/28/18 Abdomen Left;Lower   11/28/18    1945     2   Incision (Closed) 11/28/18 Abdomen Lower;Right   11/28/18    1945     2          Intake/Output Last 24 hours  Intake/Output Summary (Last 24 hours) at 11/30/2018 0654 Last data filed at 11/30/2018 0325 Gross per 24 hour  Intake -  Output 500 ml  Net -500 ml    Labs/Imaging Results for orders placed  or performed during the hospital encounter of 11/30/18 (from the past 48 hour(s))  Comprehensive metabolic panel     Status: Abnormal   Collection Time: 11/30/18 12:22 AM  Result Value Ref Range   Sodium 135 135 - 145 mmol/L   Potassium 3.9 3.5 - 5.1 mmol/L   Chloride 101 98 - 111 mmol/L   CO2 27 22 - 32 mmol/L   Glucose, Bld 160 (H) 70 - 99 mg/dL   BUN 9 6 - 20 mg/dL   Creatinine, Ser 0.85 0.44 - 1.00 mg/dL   Calcium 8.8 (L) 8.9 - 10.3 mg/dL   Total Protein 6.9 6.5 - 8.1 g/dL   Albumin 4.3 3.5 - 5.0 g/dL   AST 17 15 - 41 U/L   ALT 12 0 - 44 U/L   Alkaline Phosphatase 53 38 - 126 U/L   Total Bilirubin 0.5 0.3 - 1.2 mg/dL   GFR calc non Af Amer >60 >60 mL/min   GFR calc Af Amer >60 >60 mL/min   Anion  gap 7 5 - 15    Comment: Performed at [facility], [address] [place], [place] [address]  CBC     Status: Abnormal   Collection Time: 11/30/18 12:22 AM  Result Value Ref Range   WBC 10.6 (H) 4.0 - 10.5 K/uL   RBC 3.76 (L) 3.87 - 5.11 MIL/uL   Hemoglobin 12.3 12.0 - 15.0 g/dL   HCT 37.1 36.0 - 46.0 %   MCV 98.7 80.0 - 100.0 fL   MCH 32.7 26.0 - 34.0 pg   MCHC 33.2 30.0 - 36.0 g/dL   RDW 12.5 11.5 - 15.5 %   Platelets 236 150 - 400 K/uL   nRBC 0.0 0.0 - 0.2 %    Comment: Performed at [facility], [address] [place], [place] [address]  Urinalysis, Complete w Microscopic     Status: Abnormal   Collection Time: 11/30/18  3:01 AM  Result Value Ref Range   Color, Urine RED (A) YELLOW   APPearance CLOUDY (A) CLEAR   Specific Gravity, Urine 1.024 1.005 - 1.030   pH  5.0 - 8.0    TEST NOT REPORTED DUE TO COLOR INTERFERENCE OF URINE PIGMENT   Glucose, UA (A) NEGATIVE mg/dL    TEST NOT REPORTED DUE TO COLOR INTERFERENCE OF URINE PIGMENT   Hgb urine dipstick (A) NEGATIVE    TEST NOT REPORTED DUE TO COLOR INTERFERENCE OF URINE PIGMENT   Bilirubin Urine (A) NEGATIVE    TEST NOT REPORTED DUE TO COLOR INTERFERENCE OF URINE PIGMENT   Ketones, ur (A) NEGATIVE mg/dL    TEST NOT REPORTED DUE TO COLOR INTERFERENCE OF URINE PIGMENT   Protein, ur (A) NEGATIVE mg/dL    TEST NOT REPORTED DUE TO COLOR INTERFERENCE OF URINE PIGMENT   Nitrite (A) NEGATIVE    TEST NOT REPORTED DUE TO COLOR INTERFERENCE OF URINE PIGMENT   Leukocytes,Ua (A) NEGATIVE    TEST NOT REPORTED DUE TO COLOR INTERFERENCE OF URINE PIGMENT   RBC / HPF >50 (H) 0 - 5 RBC/hpf   WBC, UA >50 (H) 0 - 5 WBC/hpf   Bacteria, UA NONE SEEN NONE SEEN   Squamous Epithelial / LPF NONE SEEN 0 - 5   Mucus PRESENT     Comment: Performed at [facility], [address], [place], [place] [address]   Ct Abdomen Pelvis W Contrast  Addendum Date: 11/30/2018   ADDENDUM REPORT: 11/30/2018 05:30 ADDENDUM: Upon  further review,  delayed images were obtained all the way through the low anatomic pelvis (not typically obtained on routine CT the abdomen and pelvis with contrast), and these clearly demonstrate the presence of intraperitoneal iodinated contrast presumably leaking from the defect in the urinary bladder. Electronically Signed   By: Vinnie Langton M.D.   On: 11/30/2018 05:30   Result Date: 11/30/2018 CLINICAL DATA:  42 year old female with history of laparoscopic hysterectomy yesterday presenting with increasing pain in the lower abdomen and pelvis and low back. Increased vaginal bleeding. EXAM: CT ABDOMEN AND PELVIS WITH CONTRAST TECHNIQUE: Multidetector CT imaging of the abdomen and pelvis was performed using the standard protocol following bolus administration of intravenous contrast. CONTRAST:  161mL OMNIPAQUE IOHEXOL 300 MG/ML  SOLN COMPARISON:  No priors. FINDINGS: Lower chest: Unremarkable. Hepatobiliary: Innumerable small low-attenuation lesions scattered throughout the liver, too small to characterize, but statistically likely to represent tiny cysts and/or biliary hamartomas. Gallbladder is normal in appearance. Pancreas: No pancreatic mass. No pancreatic ductal dilatation. No pancreatic or peripancreatic fluid collections or inflammatory changes. Spleen: Unremarkable. Adrenals/Urinary Tract: Bilateral kidneys and adrenal glands are normal. No hydroureteronephrosis. There is a Foley catheter extending through the urinary bladder. However, the balloon tip of the catheter appears to extend beyond the superior wall of the bladder into the lower aspect of the peritoneal cavity. This is best demonstrated on sagittal image 49 of series 6, where the tip of the catheter is surrounded by multiple adjacent small bowel loops. Stomach/Bowel: Normal appearance of the stomach. No pathologic dilatation of small bowel or colon. Normal appendix. Vascular/Lymphatic: No significant atherosclerotic disease noted in the  abdominal or pelvic vasculature, aneurysm or dissection. No lymphadenopathy noted in the abdomen or pelvis. Reproductive: Status post hysterectomy. Vaginal lumen of appears mildly distended by fluid which is relatively low attenuation. Ovaries are unremarkable in appearance. Other: Small amount of pneumoperitoneum, expected given the patient's recent laparoscopic surgery. Small volume of ascites most evident in the low anatomic pelvis. Musculoskeletal: There are no aggressive appearing lytic or blastic lesions noted in the visualized portions of the skeleton. IMPRESSION: 1. Postoperative changes of recent laparoscopic hysterectomy. Unexpectedly, however, the tip of the Foley catheter and the retention balloon extend above the level of the superior bladder wall and reside in the lower aspect of the peritoneal cavity surrounded by multiple small bowel loops. Surgical consultation is recommended. Critical Value/emergent results were called by telephone at the time of interpretation on 11/30/2018 at 4:54 am to Dr. Lurline Hare, who verbally acknowledged these results. Electronically Signed: By: Vinnie Langton M.D. On: 11/30/2018 04:56   Dg Abd Acute W/chest  Result Date: 11/30/2018 CLINICAL DATA:  Recent surgery.  Not passing gas. EXAM: DG ABDOMEN ACUTE W/ 1V CHEST COMPARISON:  None. FINDINGS: There may be atelectasis at the left lung base. There is no pneumothorax. No large pleural effusion. The heart size is normal. There is gaseous distention of multiple loops of small bowel and colon scattered throughout the abdomen. There is a moderate amount of stool in the ascending colon. There is subcutaneous gas along the patient's right flank. There is no definite pneumatosis or free air. IMPRESSION: 1. Probable atelectasis at the left lung base. 2. Gaseous distention of loops of small bowel and colon without definite radiographic evidence for high-grade small bowel obstruction. 3. Moderate amount of stool in the ascending  colon. 4. Subcutaneous gas along the patient's right flank, presumably related to the patient's recent surgical intervention. Electronically Signed   By: Jamie Kato.D.  On: 11/30/2018 02:51    Pending Labs Unresulted Labs (From admission, onward)   None      Vitals/Pain Today's Vitals   11/30/18 0431 11/30/18 0531 11/30/18 0606 11/30/18 0606  BP: 102/72   104/67  Pulse: 72   81  Resp: 16   17  Temp:      TempSrc:      SpO2: 97%   95%  Weight:      Height:      PainSc:  5  3      Isolation Precautions No active isolations  Medications Medications  escitalopram (LEXAPRO) tablet 5 mg (has no administration in time range)  lactated ringers infusion (has no administration in time range)  HYDROmorphone (DILAUDID) injection 0.5 mg (has no administration in time range)  ondansetron (ZOFRAN-ODT) disintegrating tablet 4 mg (4 mg Oral Given 11/30/18 0039)  oxyCODONE-acetaminophen (PERCOCET/ROXICET) 5-325 MG per tablet 2 tablet (2 tablets Oral Given 11/30/18 0040)  LORazepam (ATIVAN) injection 0.5 mg (0.5 mg Intravenous Given 11/30/18 0321)  sodium chloride 0.9 % bolus 1,000 mL (0 mLs Intravenous Stopped 11/30/18 0531)  iohexol (OMNIPAQUE) 300 MG/ML solution 100 mL (100 mLs Intravenous Contrast Given 11/30/18 0403)  fentaNYL (SUBLIMAZE) injection 50 mcg (50 mcg Intravenous Given 11/30/18 0448)  piperacillin-tazobactam (ZOSYN) IVPB 3.375 g (0 g Intravenous Stopped 11/30/18 0607)  HYDROmorphone (DILAUDID) injection 0.5 mg (0.5 mg Intravenous Given 11/30/18 0532)    Mobility walks Low fall risk   Focused Assessments  R Recommendations: See Admitting Provider Note  Report given to:   Additional Notes: perforated bowel

## 2018-11-30 NOTE — Consult Note (Signed)
Consultation: Intraperitoneal bladder rupture, urinary retention Requested by: Dr. Prentice Docker  History of Present Illness: [name] is a 43 year old female who underwent laparoscopic hysterectomy and umbilical hernia repair on November 28, 2018.  She is now postop day #2 and presented last night with abdominal pain.  Bladder scan was greater than 350 in emergency department placed a catheter.  CT scan of the abdomen and pelvis with delayed was done which showed the Foley catheter balloon and tip in the abdominal cavity with small bowel around it.  On delayed imaging there was posterior leakage of the bladder with intraperitoneal contrast.  I discussed with gynecology on call in emergency department and she was admitted and prepped for exploratory laparoscopy.  The catheter was not draining well.  She continued to have suprapubic pain and bladder spasm.  The catheter was changed on the floor but continued to not drain.  Past Medical History:  Diagnosis Date  . Blood type O-   . Complication of anesthesia    bp dropped with 1st spinal  . Menorrhagia   . Spontaneous abortion    END OF 2011/2012, 2017  . Umbilical hernia   . Uterine fibroid    Past Surgical History:  Procedure Laterality Date  . CESAREAN SECTION     X3  . HYSTEROSCOPY  03/25/2009   HYSTEROSCOPIC IUD REMOVAL  . LAPAROSCOPIC HYSTERECTOMY N/A 11/28/2018   Procedure: HYSTERECTOMY TOTAL LAPAROSCOPIC;  Surgeon: Malachy Mood, MD;  Location: [facility] ORS;  Service: Gynecology;  Laterality: N/A;  . UMBILICAL HERNIA REPAIR N/A 11/28/2018   Procedure: HERNIA REPAIR UMBILICAL ADULT;  Surgeon: Jules Husbands, MD;  Location: [facility] ORS;  Service: General;  Laterality: N/A;  . WISDOM TOOTH EXTRACTION      Home Medications:  Medications Prior to Admission  Medication Sig Dispense Refill Last Dose  . Cholecalciferol (VITAMIN D) 50 MCG (2000 UT) tablet Take 2,000 Units by mouth 3 (three) times a week.   11/29/2018 at 0730  .  CRANBERRY CONCENTRATE PO Take 2 capsules by mouth daily.   11/29/2018 at 0730  . docusate sodium (COLACE) 100 MG capsule Take 100 mg by mouth daily.   11/29/2018 at Unknown time  . escitalopram (LEXAPRO) 5 MG tablet Take 5 mg by mouth every morning.    11/29/2018 at 0730  . ibuprofen (ADVIL) 600 MG tablet Take 1 tablet (600 mg total) by mouth every 6 (six) hours as needed for moderate pain or cramping (pain). 40 tablet 1 11/29/2018 at 10-Aug-2098  . oxyCODONE-acetaminophen (PERCOCET/ROXICET) 5-325 MG tablet Take 1-2 tablets by mouth every 4 (four) hours as needed for moderate pain. 30 tablet 0 11/29/2018 at 10-Aug-2098  . clonazePAM (KLONOPIN) 0.5 MG tablet Take 0.25 mg by mouth daily as needed for anxiety.   Not Taking at Unknown time   Allergies:  Allergies  Allergen Reactions  . Guaifenesin Palpitations  . Bactrim [Sulfamethoxazole-Trimethoprim]     Yeast infection and diarrhea  . Erythromycin Nausea And Vomiting    Family History  Problem Relation Age of Onset  . Congestive Heart Failure Mother   . Hypertension Mother   . Thyroid disease Mother        HYPO  . Lupus Mother        ERYTHEMATOSUS  . Rheum arthritis Mother   . Sjogren's syndrome Mother   . Colitis Mother        collagenous  . Alcohol abuse Father        DECEASED August 10, 2009  . Cancer Maternal  Grandmother 67       COLON  . Heart disease Maternal Grandmother   . Thyroid disease Maternal Grandmother   . Heart disease Maternal Grandfather   . Diabetes Paternal Grandfather        TYPE 2  . Heart disease Paternal Grandfather   . Cancer Cousin 44       BREAST  . Alcohol abuse Cousin 43  . Diabetes Son        TYPE 1  . Thyroid disease Maternal Aunt    Social History:  reports that she quit smoking about 18 years ago. Her smoking use included cigarettes. She has never used smokeless tobacco. She reports current alcohol use. She reports that she does not use drugs.  ROS: A complete review of systems was performed.  All systems are  negative except for pertinent findings as noted. Review of Systems  Gastrointestinal: Positive for abdominal pain.     Physical Exam:  Vital signs in last 24 hours: Temp:  [97.8 F (36.6 C)-98.9 F (37.2 C)] 98.6 F (37 C) (07/25 1028) Pulse Rate:  [72-100] 81 (07/25 1028) Resp:  [16-18] 17 (07/25 1028) BP: (102-141)/(58-91) 105/73 (07/25 1028) SpO2:  [95 %-100 %] 97 % (07/25 1028) Weight:  [19 kg] 63 kg (07/25 0024) General:  Alert and oriented, No acute distress HEENT: Normocephalic, atraumatic Cardiovascular: Regular rate and rhythm Lungs: Regular rate and effort Abdomen: Soft, nontender, mild distended, no abdominal masses, no R/G  Back: No CVA tenderness Extremities: No edema Neurologic: Grossly intact GU: Phineas Real was chaperone- on pelvic exam the vulva appeared normal.  The meatus appeared normal and the catheter appeared to be posterior to it.  Palpation of the catheter and the balloon was noted to be in the vagina.  The balloon was deflated and the catheter removed.  The meatus was prepped with Betadine after I discussed the nature risk benefits and alternatives to Foley catheter placement with the patient.  A 16 French Foley catheter was placed and clear urine obtained.  The balloon was inflated and seated at the bladder neck.  Good drainage obtained.  Urine was clear and then toward the end became red without clots.  Patient pain improved.  Laboratory Data:  Results for orders placed or performed during the hospital encounter of 11/30/18 (from the past 24 hour(s))  Comprehensive metabolic panel     Status: Abnormal   Collection Time: 11/30/18 12:22 AM  Result Value Ref Range   Sodium 135 135 - 145 mmol/L   Potassium 3.9 3.5 - 5.1 mmol/L   Chloride 101 98 - 111 mmol/L   CO2 27 22 - 32 mmol/L   Glucose, Bld 160 (H) 70 - 99 mg/dL   BUN 9 6 - 20 mg/dL   Creatinine, Ser 0.85 0.44 - 1.00 mg/dL   Calcium 8.8 (L) 8.9 - 10.3 mg/dL   Total Protein 6.9 6.5 - 8.1 g/dL   Albumin  4.3 3.5 - 5.0 g/dL   AST 17 15 - 41 U/L   ALT 12 0 - 44 U/L   Alkaline Phosphatase 53 38 - 126 U/L   Total Bilirubin 0.5 0.3 - 1.2 mg/dL   GFR calc non Af Amer >60 >60 mL/min   GFR calc Af Amer >60 >60 mL/min   Anion gap 7 5 - 15  CBC     Status: Abnormal   Collection Time: 11/30/18 12:22 AM  Result Value Ref Range   WBC 10.6 (H) 4.0 - 10.5 K/uL   RBC  3.76 (L) 3.87 - 5.11 MIL/uL   Hemoglobin 12.3 12.0 - 15.0 g/dL   HCT 37.1 36.0 - 46.0 %   MCV 98.7 80.0 - 100.0 fL   MCH 32.7 26.0 - 34.0 pg   MCHC 33.2 30.0 - 36.0 g/dL   RDW 12.5 11.5 - 15.5 %   Platelets 236 150 - 400 K/uL   nRBC 0.0 0.0 - 0.2 %  Urinalysis, Complete w Microscopic     Status: Abnormal   Collection Time: 11/30/18  3:01 AM  Result Value Ref Range   Color, Urine RED (A) YELLOW   APPearance CLOUDY (A) CLEAR   Specific Gravity, Urine 1.024 1.005 - 1.030   pH  5.0 - 8.0    TEST NOT REPORTED DUE TO COLOR INTERFERENCE OF URINE PIGMENT   Glucose, UA (A) NEGATIVE mg/dL    TEST NOT REPORTED DUE TO COLOR INTERFERENCE OF URINE PIGMENT   Hgb urine dipstick (A) NEGATIVE    TEST NOT REPORTED DUE TO COLOR INTERFERENCE OF URINE PIGMENT   Bilirubin Urine (A) NEGATIVE    TEST NOT REPORTED DUE TO COLOR INTERFERENCE OF URINE PIGMENT   Ketones, ur (A) NEGATIVE mg/dL    TEST NOT REPORTED DUE TO COLOR INTERFERENCE OF URINE PIGMENT   Protein, ur (A) NEGATIVE mg/dL    TEST NOT REPORTED DUE TO COLOR INTERFERENCE OF URINE PIGMENT   Nitrite (A) NEGATIVE    TEST NOT REPORTED DUE TO COLOR INTERFERENCE OF URINE PIGMENT   Leukocytes,Ua (A) NEGATIVE    TEST NOT REPORTED DUE TO COLOR INTERFERENCE OF URINE PIGMENT   RBC / HPF >50 (H) 0 - 5 RBC/hpf   WBC, UA >50 (H) 0 - 5 WBC/hpf   Bacteria, UA NONE SEEN NONE SEEN   Squamous Epithelial / LPF NONE SEEN 0 - 5   Mucus PRESENT    Recent Results (from the past 240 hour(s))  SARS Coronavirus 2 (Performed in [place] hospital lab)     Status: None   Collection Time: 11/22/18  2:41 PM    Specimen: Nasal Swab  Result Value Ref Range Status   SARS Coronavirus 2 NEGATIVE NEGATIVE Final    Comment: (NOTE) SARS-CoV-2 target nucleic acids are NOT DETECTED. The SARS-CoV-2 RNA is generally detectable in upper and lower respiratory specimens during the acute phase of infection. Negative results do not preclude SARS-CoV-2 infection, do not rule out co-infections with other pathogens, and should not be used as the sole basis for treatment or other patient management decisions. Negative results must be combined with clinical observations, patient history, and epidemiological information. The expected result is Negative. Fact Sheet for Patients: SugarRoll.be Fact Sheet for Healthcare Providers: https://www.woods-mathews.com/ This test is not yet approved or cleared by the [place] FDA and  has been authorized for detection and/or diagnosis of SARS-CoV-2 by FDA under an Emergency Use Authorization (EUA). This EUA will remain  in effect (meaning this test can be used) for the duration of the COVID-19 declaration under Section 56 4(b)(1) of the Act, 21 U.S.C. section 360bbb-3(b)(1), unless the authorization is terminated or revoked sooner. Performed at [facility], [place] [address] [place], [place] [address]    Creatinine: Recent Labs    11/29/18 0456 11/30/18 0022  CREATININE 0.57 0.85    Impression/Assessment/plan: Urinary retention, intraperitoneal bladder perforation at the posterior bladder- new catheter in good position, draining well. I discussed with the patient the nature risk benefits and alternatives to bladder closure.  Dr. Kenton Kingfisher will take and  perform laparoscopy and I'll assist, hopefully we can close the bladder laparoscopically. We discussed the nature risks and benefits of continued observation just with the Foley catheter or attempted bladder closure given the intraperitoneal location. Discussed she may need  open conversion.  We discussed cystoscopy and we can take a look in the bladder and perform retrogrades if needed.   Festus Aloe 11/30/2018, 11:12 AM

## 2018-11-30 NOTE — ED Triage Notes (Signed)
Patient c/o 10 of 10 pain post hysterectomy yesterday. Patient is no longer able to pass gas. Patient called OBGYN Glennon Mac) and was advised to come to ED.

## 2018-11-30 NOTE — Progress Notes (Signed)
15 minute call to floor. 

## 2018-11-30 NOTE — ED Triage Notes (Signed)
Pt ambulatory to triage with noted discomfort. Pt reports she had laparoscopic hysterectomy yesterday. Tonight she has had increasing pain in her lower abd, pelvic region and low back. Pt reports she was not able to lay down to sleep. Pt called and spoke with Dr Glennon Mac who advised her to be evaluated in the ED. Pt also reports increased vaginal bleeding.

## 2018-12-01 ENCOUNTER — Other Ambulatory Visit: Payer: Self-pay | Admitting: Obstetrics & Gynecology

## 2018-12-01 ENCOUNTER — Encounter: Payer: Self-pay | Admitting: Obstetrics & Gynecology

## 2018-12-01 DIAGNOSIS — N9971 Accidental puncture and laceration of a genitourinary system organ or structure during a genitourinary system procedure: Secondary | ICD-10-CM | POA: Diagnosis not present

## 2018-12-01 DIAGNOSIS — Z87891 Personal history of nicotine dependence: Secondary | ICD-10-CM | POA: Diagnosis not present

## 2018-12-01 DIAGNOSIS — R338 Other retention of urine: Secondary | ICD-10-CM | POA: Diagnosis not present

## 2018-12-01 DIAGNOSIS — F419 Anxiety disorder, unspecified: Secondary | ICD-10-CM | POA: Diagnosis not present

## 2018-12-01 DIAGNOSIS — Z79899 Other long term (current) drug therapy: Secondary | ICD-10-CM | POA: Diagnosis not present

## 2018-12-01 DIAGNOSIS — R1030 Lower abdominal pain, unspecified: Secondary | ICD-10-CM | POA: Diagnosis not present

## 2018-12-01 DIAGNOSIS — Z90710 Acquired absence of both cervix and uterus: Secondary | ICD-10-CM | POA: Diagnosis not present

## 2018-12-01 MED ORDER — MIRABEGRON ER 50 MG PO TB24: 50.0000 mg | ORAL_TABLET | Freq: Every day | ORAL | 1 refills | Status: DC

## 2018-12-01 NOTE — Plan of Care (Signed)
Vs stable; up with assistance; tolerating regular diet; able to SL the iv; taking po percocet and iv toradol for pain control; foley to stay in place and pt will be discharged with foley

## 2018-12-01 NOTE — Progress Notes (Signed)
I am following this patient who is postop day 1 from a intraperitoneal bladder repair.  Interval: The patient is feeling bloated, but otherwise doing well. No acute events overnight.  Exam: NAD Vitals:   11/30/18 1954 11/30/18 2351 12/01/18 0416 12/01/18 0802  BP: 112/65 (!) 91/53 104/73 98/62  Pulse: 89 84 78 74  Resp: 18 18 16 18   Temp: 98 F (36.7 C) 98.6 F (37 C) 98.5 F (36.9 C) 98.2 F (36.8 C)  TempSrc: Oral Oral Oral Oral  SpO2: 100% 98% 95%   Weight:      Height:        Intake/Output Summary (Last 24 hours) at 12/01/2018 1021 Last data filed at 12/01/2018 1004 Gross per 24 hour  Intake 1841.14 ml  Output 2955 ml  Net -1113.86 ml   Non-labored breathing Abdomen is softly distended, incisions c/d/i Foley draining clear yellow urine. Ext symmetric  Recent Labs    11/29/18 0456 11/30/18 0022  WBC 11.7* 10.6*  HGB 10.8* 12.3  HCT 31.5* 37.1   Recent Labs    11/29/18 0456 11/30/18 0022  NA 134* 135  K 3.4* 3.9  CL 101 101  CO2 26 27  GLUCOSE 113* 160*  BUN 9 9  CREATININE 0.57 0.85  CALCIUM 8.0* 8.8*   No results for input(s): LABPT, INR in the last 72 hours. No results for input(s): PSA in the last 72 hours. No results for input(s): LABURIN in the last 72 hours. Results for orders placed or performed during the hospital encounter of 11/22/18  SARS Coronavirus 2 (Performed in [facility] hospital lab)     Status: None   Collection Time: 11/22/18  2:41 PM   Specimen: Nasal Swab  Result Value Ref Range Status   SARS Coronavirus 2 NEGATIVE NEGATIVE Final    Comment: (NOTE) SARS-CoV-2 target nucleic acids are NOT DETECTED. The SARS-CoV-2 RNA is generally detectable in upper and lower respiratory specimens during the acute phase of infection. Negative results do not preclude SARS-CoV-2 infection, do not rule out co-infections with other pathogens, and should not be used as the sole basis for treatment or other patient management  decisions. Negative results must be combined with clinical observations, patient history, and epidemiological information. The expected result is Negative. Fact Sheet for Patients: SugarRoll.be Fact Sheet for Healthcare Providers: https://www.woods-mathews.com/ This test is not yet approved or cleared by the [place] FDA and  has been authorized for detection and/or diagnosis of SARS-CoV-2 by FDA under an Emergency Use Authorization (EUA). This EUA will remain  in effect (meaning this test can be used) for the duration of the COVID-19 declaration under Section 56 4(b)(1) of the Act, 21 U.S.C. section 360bbb-3(b)(1), unless the authorization is terminated or revoked sooner. Performed at [facility], [place] [address], [place], [place] [address]     Imp: doing well after bladder repair, urine clear. Rec: likely to have persistent ileus from urine leak, but currently doing well.  Plan to discharge her home.  I cautioned her about the ileus and not to eat to much until things improve, encouraged ambulation.  Also suggested myrbetriq in addition to the pain medication to help with bladder spasms.  She will need a catheter until her cystogram is normal ~10 days.  She has followed scheduled by GYN, she will contact urology for further problems or questions.

## 2018-12-01 NOTE — Progress Notes (Signed)
Discharge instructions complete and prescriptions sent to pharmacy by provider. Education completed regarding foley catheter care at home and changing between urine leg bag and standard bag. Extra leg bags, standard bag, and leg strap given. Patient verbalizes understanding of teaching. Patient discharged home via wheelchair at 1300.

## 2018-12-01 NOTE — Discharge Summary (Signed)
Gynecology Physician Postoperative Discharge Summary  Patient ID: Jessica Murphy MRN: [id number] DOB/AGE: Jul 13, 1975 43 y.o.  Admit Date: 11/30/2018 Discharge Date: 12/01/2018  Preoperative Diagnoses: Bladder perforation  Procedures: Procedure(s) (LRB): LAPAROSCOPY OPERATIVE,cystoscopy,bladder repair,retrograde pyelogram (N/A)  Significant Labs: CBC Latest Ref Rng & Units 11/30/2018 11/29/2018 11/19/2018  WBC 4.0 - 10.5 K/uL 10.6(H) 11.7(H) 5.3  Hemoglobin 12.0 - 15.0 g/dL 12.3 10.8(L) 14.6  Hematocrit 36.0 - 46.0 % 37.1 31.5(L) 43.9  Platelets 150 - 400 K/uL 236 203 236    Hospital Course:  Jessica Murphy is a 43 y.o. [id number]  admitted for surgery related to a post operative (hysterectomy 2 days ago) bladder cystotomy noted by pain and by CT scan.  She underwent the procedures as mentioned above, her operation was uncomplicated. For further details about surgery, please refer to the operative report. Patient had an uncomplicated postoperative course. By time of discharge on POD#1, her pain was controlled on oral pain medications; she was ambulating, voiding without difficulty, tolerating regular diet and passing flatus. She was deemed stable for discharge to home.   Discharge Exam: Blood pressure 98/62, pulse 74, temperature 98.2 F (36.8 C), temperature source Oral, resp. rate 18, height 5\' 4"  (1.626 m), weight 63 kg, last menstrual period 11/17/2018, SpO2 95 %. General appearance: alert and no distress  Resp: clear to auscultation bilaterally  Cardio: regular rate and rhythm  GI: soft, non-tender; bowel sounds normal; no masses, no organomegaly.  Incision: C/D/I, no erythema, no drainage noted Pelvic: scant blood on pad  Extremities: extremities normal, atraumatic, no cyanosis or edema and Homans sign is negative, no sign of DVT  Discharged Condition: Stable  Disposition: Discharge disposition: 01-Home or Self Care       Discharge Instructions    Call MD for:   persistant nausea and vomiting   Complete by: As directed    Call MD for:  redness, tenderness, or signs of infection (pain, swelling, redness, odor or green/yellow discharge around incision site)   Complete by: As directed    Call MD for:  severe uncontrolled pain   Complete by: As directed    Call MD for:  temperature >100.4   Complete by: As directed    Change dressing (specify)   Complete by: As directed    Dressing change: remove any dressings tomorrow   Diet general   Complete by: As directed    Discharge instructions   Complete by: As directed    Resume activities according to discharge instruction sheets   Increase activity slowly   Complete by: As directed      Allergies as of 12/01/2018      Reactions   Guaifenesin Palpitations   Bactrim [sulfamethoxazole-trimethoprim]    Yeast infection and diarrhea   Erythromycin Nausea And Vomiting      Medication List    TAKE these medications   clonazePAM 0.5 MG tablet Commonly known as: KLONOPIN Take 0.25 mg by mouth daily as needed for anxiety.   CRANBERRY CONCENTRATE PO Take 2 capsules by mouth daily.   docusate sodium 100 MG capsule Commonly known as: COLACE Take 100 mg by mouth daily.   escitalopram 5 MG tablet Commonly known as: LEXAPRO Take 5 mg by mouth every morning.   ibuprofen 600 MG tablet Commonly known as: ADVIL Take 1 tablet (600 mg total) by mouth every 6 (six) hours as needed for moderate pain or cramping (pain).   mirabegron ER 50 MG Tb24 tablet Commonly known as: Myrbetriq Take 1 tablet (  50 mg total) by mouth daily.   nitrofurantoin (macrocrystal-monohydrate) 100 MG capsule Commonly known as: Macrobid Take 1 capsule (100 mg total) by mouth at bedtime.   oxyCODONE-acetaminophen 5-325 MG tablet Commonly known as: PERCOCET/ROXICET Take 1-2 tablets by mouth every 4 (four) hours as needed for moderate pain.   Vitamin D 50 MCG (2000 UT) tablet Take 2,000 Units by mouth 3 (three) times a week.             Discharge Care Instructions  (From admission, onward)         Start     Ordered   12/01/18 0000  Change dressing (specify)    Comments: Dressing change: remove any dressings tomorrow   12/01/18 0947         Follow-up Information    Malachy Mood, MD. Schedule an appointment as soon as possible for a visit in 4 day(s).   Specialty: Obstetrics and Gynecology Why: For post op check and planning for cystogram Contact information: [address] [place] [address] [phone]         Foley care discussed.  Plan Cystogram prior to removal at 10 days post op.  Barnett Applebaum, MD

## 2018-12-02 ENCOUNTER — Telehealth: Payer: Self-pay | Admitting: Obstetrics & Gynecology

## 2018-12-02 NOTE — Telephone Encounter (Signed)
Patient is schedule for 12/06/18 with [facility]

## 2018-12-02 NOTE — Telephone Encounter (Signed)
-----   Message from Gae Dry, MD sent at 12/01/2018  9:48 AM EDT ----- Regarding: appt Sch post op w AMS for thurs or fri this week (if not in office, then w me)

## 2018-12-03 LAB — SURGICAL PATHOLOGY

## 2018-12-03 NOTE — Addendum Note (Signed)
Addendum  created 12/03/18 1014 by Doreen Salvage, CRNA   Charge Capture section accepted, Visit diagnoses modified

## 2018-12-06 ENCOUNTER — Encounter: Payer: Self-pay | Admitting: Obstetrics & Gynecology

## 2018-12-06 ENCOUNTER — Other Ambulatory Visit: Payer: Self-pay

## 2018-12-06 ENCOUNTER — Ambulatory Visit (INDEPENDENT_AMBULATORY_CARE_PROVIDER_SITE_OTHER): Payer: 59 | Admitting: Obstetrics & Gynecology

## 2018-12-06 VITALS — BP 102/70 | Wt 142.0 lb

## 2018-12-06 DIAGNOSIS — N9981 Other intraoperative complications of genitourinary system: Secondary | ICD-10-CM

## 2018-12-06 DIAGNOSIS — Z90710 Acquired absence of both cervix and uterus: Secondary | ICD-10-CM

## 2018-12-06 NOTE — Patient Instructions (Signed)
Cystogram A cystogram, which is also called cystography, is a type of X-ray exam that is used to check for problems with the bladder. You may need this exam if you have blood in your urine (hematuria), urinary tract infections that keep coming back, or possible damage to your bladder from an injury. During the exam, a type of dye will be injected into your bladder. This dye shows up on an X-ray. The dye makes it easier for your health care provider to see your bladder and check for any possible problems. A cystogram can be used to help diagnose problems such as:  Cysts.  Blood clots (hematoma).  Punctures, tears, or other damage to the bladder.  Bladder tumors.  Backward flow of urine from the bladder to the ureter (vesicoureteral reflux).  An abnormal tunnel (fistula) from the bladder to another organ. Tell a health care provider about:  Any allergies you have.  All medicines you are taking, including vitamins, herbs, eye drops, creams, and over-the-counter medicines.  Any problems you or family members have had with anesthetic medicines or injectable dyes.  Any blood disorders you have.  Any surgeries you have had.  Any medical conditions you have.  Whether you are pregnant or may be pregnant. What are the risks? Generally, this is a safe procedure. However, problems may occur, including:  Exposure to a small amount of radiation.  Urinary tract infection.  Bleeding.  Allergic reaction to the dye. This is rare. What happens before the procedure?  Ask your health care provider about: ? Changing or stopping your regular medicines. This is especially important if you are taking diabetes medicines or blood thinners. ? Taking medicines such as aspirin and ibuprofen. These medicines can thin your blood. Do not take these medicines unless your health care provider tells you to take them. ? Taking over-the-counter medicines, vitamins, herbs, and supplements.  Follow  instructions from your health care provider about eating or drinking restrictions. What happens during the procedure?   You will lie on your back on an X-ray table.  A thin, flexible tube (catheter) will be passed through your urethra and into your bladder. The urethra is the part of your body that drains urine from the bladder.  The health care provider will inject dye into your bladder through the catheter. When your bladder is full, the catheter will be clamped.  X-ray images of your bladder area will be taken. You may be asked to move into different positions for some of the scans.  The catheter will then be removed.  In some cases, you may be asked to urinate while more X-rays are taken of your bladder and urethra (voiding cystogram). The procedure may vary among health care providers and hospitals. What happens after the procedure?  Return to your normal activities and your normal diet as told by your health care provider.  Drink enough fluid to keep your urine pale yellow. This will help to flush the dye out of your body.  It is up to you to get your test results. Ask your health care provider, or the department that is doing the test, when your results will be ready. Summary  A cystogram, which is also called cystography, is a type of X-ray exam that is used to check for problems with the bladder.  A catheter will be passed through your urethra and inserted into your bladder. Your health care provider will inject dye into your bladder through the catheter.  The dye shows up on  an X-ray, making it easier for your health care provider to see your bladder and check for any possible problems.  After the procedure, you should drink enough fluid to keep your urine pale yellow. This will help to flush the dye out of your body. This information is not intended to replace advice given to you by your health care provider. Make sure you discuss any questions you have with your health  care provider. Document Released: 05/26/2004 Document Revised: 04/06/2017 Document Reviewed: 04/02/2017 Elsevier Patient Education  2020 [employer].

## 2018-12-06 NOTE — Progress Notes (Signed)
  Postoperative Follow-up Patient presents post op from laparoscopic repair of cystotomy folowing hysterectomy for fibroids, 1 week ago.  Subjective: Patient reports marked improvement in her preop symptoms. Eating a regular diet without difficulty. The patient is not having any pain.  Activity: sedentary. Patient reports additional symptom's since surgery of no spasm, pain, bleeding.  Catheter not causing problems.  Objective: BP 102/70 (BP Location: Left Arm)   Wt 142 lb (64.4 kg)   LMP 11/17/2018 (Exact Date)   BMI 24.37 kg/m  Physical Exam Constitutional:      General: She is not in acute distress.    Appearance: She is well-developed.  Cardiovascular:     Rate and Rhythm: Normal rate.  Pulmonary:     Effort: Pulmonary effort is normal.  Abdominal:     General: There is no distension.     Palpations: Abdomen is soft.     Tenderness: There is no abdominal tenderness.     Comments: Incision Healing Well   Musculoskeletal: Normal range of motion.  Neurological:     Mental Status: She is alert and oriented to person, place, and time.     Cranial Nerves: No cranial nerve deficit.  Skin:    General: Skin is warm and dry.   Clear UOP in foley bag  Assessment: s/p :  hysterectomy, then bladder repair stable  Plan: Patient has done well after surgery with no apparent complications.  I have discussed the post-operative course to date, and the expected progress moving forward.  The patient understands what complications to be concerned about.  I will see the patient in routine follow up, or sooner if needed.    Activity plan: No heavy lifting.  Pelvic rest. Cystogram scheduled for Tuesday, then will have pt remove catheter if good results (pt a RN and knows how to remove).  Hoyt Koch 12/06/2018, 11:35 AM

## 2018-12-10 ENCOUNTER — Ambulatory Visit
Admission: RE | Admit: 2018-12-10 | Discharge: 2018-12-10 | Disposition: A | Discharge status: Home / Self Care | Payer: 59 | Source: Ambulatory Visit | Attending: Obstetrics & Gynecology | Admitting: Obstetrics & Gynecology

## 2018-12-10 ENCOUNTER — Other Ambulatory Visit: Payer: Self-pay

## 2018-12-10 DIAGNOSIS — Z90710 Acquired absence of both cervix and uterus: Secondary | ICD-10-CM | POA: Insufficient documentation

## 2018-12-10 DIAGNOSIS — S3720XA Unspecified injury of bladder, initial encounter: Secondary | ICD-10-CM | POA: Diagnosis not present

## 2018-12-10 DIAGNOSIS — N9981 Other intraoperative complications of genitourinary system: Secondary | ICD-10-CM | POA: Insufficient documentation

## 2018-12-10 MED ORDER — IOTHALAMATE MEGLUMINE 17.2 % UR SOLN: 750.0000 mL | Freq: Once | URETHRAL | Status: DC | PRN

## 2018-12-10 NOTE — Progress Notes (Signed)
D/W pt results, reassured Pt to remove foley as instructed (she is an Therapist, sports and knows how) Make sure empties bladder regularly and monitor for concerning sx's F/U for post op  Barnett Applebaum, MD, [place], [place] 12/10/2018  12:05 PM

## 2018-12-11 DIAGNOSIS — D225 Melanocytic nevi of trunk: Secondary | ICD-10-CM | POA: Diagnosis not present

## 2018-12-11 DIAGNOSIS — L821 Other seborrheic keratosis: Secondary | ICD-10-CM | POA: Diagnosis not present

## 2018-12-11 DIAGNOSIS — L814 Other melanin hyperpigmentation: Secondary | ICD-10-CM | POA: Diagnosis not present

## 2018-12-19 ENCOUNTER — Telehealth: Payer: Self-pay | Admitting: *Deleted

## 2018-12-19 NOTE — Telephone Encounter (Signed)
Patient called and was told that she didn't have to pay another co pay due to seeing Dr.Byrnett on 11/05/18 and then when he left she came in to see Dr.Pabon and the co pay was suppose to be waived. Patient stated that she got a bill for co pay for Dr.Pabons visit on 11/25/18. Please call and advise

## 2019-01-07 ENCOUNTER — Other Ambulatory Visit: Payer: Self-pay

## 2019-01-07 ENCOUNTER — Encounter: Payer: Self-pay | Admitting: Obstetrics and Gynecology

## 2019-01-07 ENCOUNTER — Ambulatory Visit (INDEPENDENT_AMBULATORY_CARE_PROVIDER_SITE_OTHER): Payer: 59 | Admitting: Obstetrics and Gynecology

## 2019-01-07 VITALS — BP 112/82 | HR 78 | Ht 64.0 in | Wt 141.0 lb

## 2019-01-07 DIAGNOSIS — Z1239 Encounter for other screening for malignant neoplasm of breast: Secondary | ICD-10-CM

## 2019-01-07 DIAGNOSIS — Z4889 Encounter for other specified surgical aftercare: Secondary | ICD-10-CM

## 2019-01-07 NOTE — Progress Notes (Signed)
Postoperative Follow-up Patient presents post op from [place], BS, cystoscopy followed by POD2 cystotomy repair laparoscopic with 2 week foley catheter and negative cystogram 6 weeks ago for abnormal uterine bleeding and uterine fibroids.  Subjective: Patient reports marked improvement in her preop symptoms. Eating a regular diet without difficulty. The patient is not having any pain.  Activity: normal activities of daily living.  No voiding dysfunction or problems  Objective: Blood pressure 112/82, pulse 78, height 5\' 4"  (1.626 m), weight 141 lb (64 kg), last menstrual period 11/17/2018.  General: NAD Pulmonary: no increased work of breathing Abdomen: soft, non-tender, non-distended, incision(s) D/C/I GU: normal external female genitalia vaginal cuff intact, well healed Extremities: no edema Neurologic: normal gait    Admission on 11/30/2018, Discharged on 12/01/2018  Component Date Value Ref Range Status  . Sodium 11/30/2018 135  135 - 145 mmol/L Final  . Potassium 11/30/2018 3.9  3.5 - 5.1 mmol/L Final  . Chloride 11/30/2018 101  98 - 111 mmol/L Final  . CO2 11/30/2018 27  22 - 32 mmol/L Final  . Glucose, Bld 11/30/2018 160* 70 - 99 mg/dL Final  . BUN 11/30/2018 9  6 - 20 mg/dL Final  . Creatinine, Ser 11/30/2018 0.85  0.44 - 1.00 mg/dL Final  . Calcium 11/30/2018 8.8* 8.9 - 10.3 mg/dL Final  . Total Protein 11/30/2018 6.9  6.5 - 8.1 g/dL Final  . Albumin 11/30/2018 4.3  3.5 - 5.0 g/dL Final  . AST 11/30/2018 17  15 - 41 U/L Final  . ALT 11/30/2018 12  0 - 44 U/L Final  . Alkaline Phosphatase 11/30/2018 53  38 - 126 U/L Final  . Total Bilirubin 11/30/2018 0.5  0.3 - 1.2 mg/dL Final  . GFR calc non Af Amer 11/30/2018 >60  >60 mL/min Final  . GFR calc Af Amer 11/30/2018 >60  >60 mL/min Final  . Anion gap 11/30/2018 7  5 - 15 Final   Performed at [facility], [address] [place], [place] [address]  . WBC 11/30/2018 10.6* 4.0 - 10.5 K/uL Final  . RBC  11/30/2018 3.76* 3.87 - 5.11 MIL/uL Final  . Hemoglobin 11/30/2018 12.3  12.0 - 15.0 g/dL Final  . HCT 11/30/2018 37.1  36.0 - 46.0 % Final  . MCV 11/30/2018 98.7  80.0 - 100.0 fL Final  . MCH 11/30/2018 32.7  26.0 - 34.0 pg Final  . MCHC 11/30/2018 33.2  30.0 - 36.0 g/dL Final  . RDW 11/30/2018 12.5  11.5 - 15.5 % Final  . Platelets 11/30/2018 236  150 - 400 K/uL Final  . nRBC 11/30/2018 0.0  0.0 - 0.2 % Final   Performed at [facility], [address] [place], [place] [address]  . Color, Urine 11/30/2018 RED* YELLOW Final  . APPearance 11/30/2018 CLOUDY* CLEAR Final  . Specific Gravity, Urine 11/30/2018 1.024  1.005 - 1.030 Final  . pH 11/30/2018 TEST NOT REPORTED DUE TO COLOR INTERFERENCE OF URINE PIGMENT  5.0 - 8.0 Final  . Glucose, UA 11/30/2018 TEST NOT REPORTED DUE TO COLOR INTERFERENCE OF URINE PIGMENT* NEGATIVE mg/dL Final  . Hgb urine dipstick 11/30/2018 TEST NOT REPORTED DUE TO COLOR INTERFERENCE OF URINE PIGMENT* NEGATIVE Final  . Bilirubin Urine 11/30/2018 TEST NOT REPORTED DUE TO COLOR INTERFERENCE OF URINE PIGMENT* NEGATIVE Final  . Ketones, ur 11/30/2018 TEST NOT REPORTED DUE TO COLOR INTERFERENCE OF URINE PIGMENT* NEGATIVE mg/dL Final  . Protein, ur 11/30/2018 TEST NOT REPORTED DUE TO COLOR  INTERFERENCE OF URINE PIGMENT* NEGATIVE mg/dL Final  . Nitrite 11/30/2018 TEST NOT REPORTED DUE TO COLOR INTERFERENCE OF URINE PIGMENT* NEGATIVE Final  . Chalmers Guest 11/30/2018 TEST NOT REPORTED DUE TO COLOR INTERFERENCE OF URINE PIGMENT* NEGATIVE Final  . RBC / HPF 11/30/2018 >50* 0 - 5 RBC/hpf Final  . WBC, UA 11/30/2018 >50* 0 - 5 WBC/hpf Final  . Bacteria, UA 11/30/2018 NONE SEEN  NONE SEEN Final  . Squamous Epithelial / LPF 11/30/2018 NONE SEEN  0 - 5 Final  . Mucus 11/30/2018 PRESENT   Final   Performed at [facility], [address] [place], [place] [address]    Assessment: 43 y.o. s/p TLH, BS, cystoscopy followed by POD2 cystotomy repair laparoscopic  with 2 week foley catheter and negative cystogram stable  Plan: Patient has done well after surgery with no apparent complications.  I have discussed the post-operative course to date, and the expected progress moving forward.  The patient understands what complications to be concerned about.  I will see the patient in routine follow up, or sooner if needed.    Activity plan: No restriction.   Malachy Mood, MD, [facility], [facility] 01/07/2019, 2:31 PM

## 2019-01-07 NOTE — Patient Instructions (Signed)
[facility] [address] [place] [address]  [facility]  [address] [place] [address]  Phone: [phone]  

## 2019-04-24 ENCOUNTER — Ambulatory Visit
Admission: RE | Admit: 2019-04-24 | Discharge: 2019-04-24 | Disposition: A | Discharge status: Home / Self Care | Payer: 59 | Source: Ambulatory Visit | Attending: Obstetrics and Gynecology | Admitting: Obstetrics and Gynecology

## 2019-04-24 DIAGNOSIS — Z1231 Encounter for screening mammogram for malignant neoplasm of breast: Secondary | ICD-10-CM | POA: Insufficient documentation

## 2019-04-24 DIAGNOSIS — Z1239 Encounter for other screening for malignant neoplasm of breast: Secondary | ICD-10-CM

## 2019-04-26 DIAGNOSIS — R300 Dysuria: Secondary | ICD-10-CM | POA: Diagnosis not present

## 2019-04-26 DIAGNOSIS — R350 Frequency of micturition: Secondary | ICD-10-CM | POA: Diagnosis not present

## 2019-06-19 ENCOUNTER — Encounter: Payer: Self-pay | Admitting: Physician Assistant

## 2019-06-19 ENCOUNTER — Ambulatory Visit (INDEPENDENT_AMBULATORY_CARE_PROVIDER_SITE_OTHER): Payer: 59 | Admitting: Physician Assistant

## 2019-06-19 ENCOUNTER — Other Ambulatory Visit: Payer: Self-pay

## 2019-06-19 DIAGNOSIS — F419 Anxiety disorder, unspecified: Secondary | ICD-10-CM

## 2019-06-19 MED ORDER — ESCITALOPRAM OXALATE 10 MG PO TABS: 5.0000 mg | ORAL_TABLET | Freq: Every day | ORAL | 3 refills | Status: DC

## 2019-06-19 MED FILL — ESCITALOPRAM 10 MG TABLET: 10 | 90 days supply | Qty: 45 | Fill #0

## 2019-06-19 NOTE — Progress Notes (Signed)
Patient presents to clinic today to establish care.  Chronic Issues: Anxiety --patient on longstanding regimen of Lexapro 5 mg once daily.  Has done very well on this regimen.  Notes good control of symptoms without significant breakthrough symptoms.  Denies suicidal thought or ideation.  Health Maintenance: Immunizations -- Will obtain records. Is up-to-date on influenza and covid vaccines.    Mammogram -- UTD per patient. Records requested PAP -- UTD per patient. Records requested.   Past Medical History:  Diagnosis Date  . Blood type O-   . Complication of anesthesia    bp dropped with 1st spinal  . Menorrhagia   . Spontaneous abortion    END OF 2011/2012, 2017  . Umbilical hernia   . Uterine fibroid     Past Surgical History:  Procedure Laterality Date  . CESAREAN SECTION     X3  . HYSTEROSCOPY  03/25/2009   HYSTEROSCOPIC IUD REMOVAL  . LAPAROSCOPIC HYSTERECTOMY N/A 11/28/2018   Procedure: HYSTERECTOMY TOTAL LAPAROSCOPIC;  Surgeon: Malachy Mood, MD;  Location: [facility];  Service: Gynecology;  Laterality: N/A;  . LAPAROSCOPY N/A 11/30/2018   Procedure: LAPAROSCOPY OPERATIVE,cystoscopy,bladder repair,retrograde pyelogram;  Surgeon: Gae Dry, MD;  Location: [facility];  Service: Gynecology;  Laterality: N/A;  . UMBILICAL HERNIA REPAIR N/A 11/28/2018   Procedure: HERNIA REPAIR UMBILICAL ADULT;  Surgeon: Jules Husbands, MD;  Location: [facility];  Service: General;  Laterality: N/A;  . WISDOM TOOTH EXTRACTION      Current Outpatient Medications on File Prior to Visit  Medication Sig Dispense Refill  . Cholecalciferol (VITAMIN D) 50 MCG (2000 UT) tablet Take 2,000 Units by mouth 3 (three) times a week.    . escitalopram (LEXAPRO) 5 MG tablet Take 5 mg by mouth every morning.     . vitamin B-12 (CYANOCOBALAMIN) 1000 MCG tablet Take 1,000 mcg by mouth daily.    . clonazePAM (KLONOPIN) 0.5 MG tablet Take 0.25 mg by mouth daily as needed for anxiety.     No current  facility-administered medications on file prior to visit.    Allergies  Allergen Reactions  . Guaifenesin Palpitations  . Bactrim [Sulfamethoxazole-Trimethoprim]     Yeast infection and diarrhea  . Erythromycin Nausea And Vomiting    Family History  Problem Relation Age of Onset  . Congestive Heart Failure Mother   . Hypertension Mother   . Thyroid disease Mother        HYPO  . Lupus Mother        ERYTHEMATOSUS  . Rheum arthritis Mother   . Sjogren's syndrome Mother   . Colitis Mother        collagenous  . Alcohol abuse Father        DECEASED 08/08/09  . Cancer Maternal Grandmother 58       COLON  . Heart disease Maternal Grandmother   . Thyroid disease Maternal Grandmother   . Heart disease Maternal Grandfather   . Diabetes Paternal Grandfather        TYPE 2  . Heart disease Paternal Grandfather   . Cancer Cousin 44       BREAST  . Alcohol abuse Cousin 43  . Diabetes Son        TYPE 1  . Thyroid disease Maternal Aunt   . Breast cancer Cousin 44       maternal    Social History   Socioeconomic History  . Marital status: Married    Spouse name: Not on file  . Number  of children: 3  . Years of education: 69  . Highest education level: Not on file  Occupational History  . Occupation: NURSE    Comment: [employer]  Tobacco Use  . Smoking status: Former Smoker    Types: Cigarettes    Quit date: 11/18/2000    Years since quitting: 18.5  . Smokeless tobacco: Never Used  . Tobacco comment: more socailly  Substance and Sexual Activity  . Alcohol use: Yes    Comment: occ  . Drug use: No  . Sexual activity: Yes    Partners: Male    Birth control/protection: None  Other Topics Concern  . Not on file  Social History Narrative  . Not on file   Social Determinants of Health   Financial Resource Strain:   . Difficulty of Paying Living Expenses: Not on file  Food Insecurity:   . Worried About Charity fundraiser in the Last Year: Not on file  . Ran Out  of Food in the Last Year: Not on file  Transportation Needs:   . Lack of Transportation (Medical): Not on file  . Lack of Transportation (Non-Medical): Not on file  Physical Activity:   . Days of Exercise per Week: Not on file  . Minutes of Exercise per Session: Not on file  Stress:   . Feeling of Stress : Not on file  Social Connections:   . Frequency of Communication with Friends and Family: Not on file  . Frequency of Social Gatherings with Friends and Family: Not on file  . Attends Religious Services: Not on file  . Active Member of Clubs or Organizations: Not on file  . Attends Archivist Meetings: Not on file  . Marital Status: Not on file  Intimate Partner Violence:   . Fear of Current or Ex-Partner: Not on file  . Emotionally Abused: Not on file  . Physically Abused: Not on file  . Sexually Abused: Not on file   Review of Systems  Constitutional: Negative for fever and weight loss.  HENT: Negative for ear discharge, ear pain, hearing loss and tinnitus.   Eyes: Negative for blurred vision, double vision, photophobia and pain.  Respiratory: Negative for cough and shortness of breath.   Cardiovascular: Negative for chest pain and palpitations.  Gastrointestinal: Negative for abdominal pain, blood in stool, constipation, diarrhea, heartburn, melena, nausea and vomiting.  Genitourinary: Negative for dysuria, flank pain, frequency, hematuria and urgency.  Musculoskeletal: Negative for falls.  Neurological: Negative for dizziness, loss of consciousness and headaches.  Endo/Heme/Allergies: Negative for environmental allergies.  Psychiatric/Behavioral: Negative for depression, hallucinations, substance abuse and suicidal ideas. The patient is not nervous/anxious and does not have insomnia.    BP 120/80   Pulse 76   Temp 98.8 F (37.1 C) (Temporal)   Resp 14   Ht 5\' 4"  (1.626 m)   Wt 143 lb (64.9 kg)   LMP 11/17/2018 (Exact Date)   SpO2 99%   BMI 24.55 kg/m    Physical Exam Vitals reviewed.  HENT:     Head: Normocephalic and atraumatic.     Right Ear: Tympanic membrane, ear canal and external ear normal.     Left Ear: Tympanic membrane, ear canal and external ear normal.     Nose: Nose normal. No mucosal edema.     Mouth/Throat:     Pharynx: Uvula midline. No oropharyngeal exudate or posterior oropharyngeal erythema.  Eyes:     Conjunctiva/sclera: Conjunctivae normal.  Pupils: Pupils are equal, round, and reactive to light.  Neck:     Thyroid: No thyromegaly.  Cardiovascular:     Rate and Rhythm: Normal rate and regular rhythm.     Heart sounds: Normal heart sounds.  Pulmonary:     Effort: Pulmonary effort is normal. No respiratory distress.     Breath sounds: Normal breath sounds. No wheezing or rales.  Abdominal:     General: Bowel sounds are normal. There is no distension.     Palpations: Abdomen is soft. There is no mass.     Tenderness: There is no abdominal tenderness. There is no guarding or rebound.  Musculoskeletal:     Cervical back: Neck supple.  Lymphadenopathy:     Cervical: No cervical adenopathy.  Skin:    General: Skin is warm and dry.     Findings: No rash.  Neurological:     Mental Status: She is alert and oriented to person, place, and time.     Cranial Nerves: No cranial nerve deficit.    Assessment/Plan: 1. Anxiety Stable.  Continue current medication regimen.  Refill sent in.  Did change from a 20 mg tablet to a 10 mg tablet since she only takes 5 mg once a day.  This will be much easier to dose for her.  Follow-up when due for CPE.  This visit occurred during the SARS-CoV-2 public health emergency.  Safety protocols were in place, including screening questions prior to the visit, additional usage of staff PPE, and extensive cleaning of exam room while observing appropriate contact time as indicated for disinfecting solutions.     Leeanne Rio, PA-C

## 2019-06-19 NOTE — Patient Instructions (Signed)
I have made sure you have refills on file for the Lexapro 10 mg tablet -- take 1/2 tablet daily.   Keep up with well-balanced diet and exercise.   We will follow-up later in the year when you are due for a physical. Sooner if you need me.  It was very nice meeting you today. Welcome to [facility]!

## 2019-07-01 MED FILL — ESCITALOPRAM 10 MG TABLET: 10 | 90 days supply | Qty: 45 | Fill #0

## 2019-07-07 DIAGNOSIS — Z20828 Contact with and (suspected) exposure to other viral communicable diseases: Secondary | ICD-10-CM | POA: Diagnosis not present

## 2019-07-07 DIAGNOSIS — Z03818 Encounter for observation for suspected exposure to other biological agents ruled out: Secondary | ICD-10-CM | POA: Diagnosis not present

## 2019-07-30 ENCOUNTER — Telehealth: Payer: Self-pay

## 2019-07-30 NOTE — Telephone Encounter (Signed)
Patient states her appointment on 06/19/19 was supposed to be to establish care and for a physical. I informed the patient it was to establish care but it was not a physical. Patient states office visit needs to be coded for a physical. She states she will not pay to be seen for anxiety. Please advise.

## 2019-07-30 NOTE — Telephone Encounter (Signed)
Patient called in and states that she had a physical back on 06/19/19 with Einar Pheasant but didn't have lab work. She was wanting to be scheduled for labwork. I informed her that labwork is normally done with your physical and other intervals as deemed necessary by the PCP. I looked back at the patient's visit and informed her that it was not a physical. Patient states that it was indeed a physical and visit to establish care. I informed her that indeed it was a visit to establish care but not a physical. I informed patient what was discussed and she stated she is not paying for a visit to discuss anxiety. Patient is adamant that she is not going to pay for this visit and would like the office manager to call her because she states they will help her. She can be reached at [phone]. Patient states she wants a call back TODAY.  Patient wants labwork done.

## 2019-07-30 NOTE — Telephone Encounter (Signed)
Reviewing note now as I am out of office today through Sunday but this was brought to my attention.   I am sorry that she is upset. I reviewed the note from 06/19/2019 which was an encounter to establish care. No request of CPE made to me at that time or mentioned in appt notes. We reviewed chronic medical history and discussed her anxiety regimen. No adjustment had to be made to her dose as she was doing well but we altered the total tablet dosage to make easier for her to take. (she was cutting a 20 mg tablet in 1/4s). I am assuming this is why LPN told her that her visit was for anxiety when reviewing chart today. We discussed at visit (included in my note and patient AVS) that we would follow-up when she was due for her next CPE with fasting labs as she transferred care from across the road and were awaiting records. She did not request labs (at least not to me) at time of visit so none were drawn. The plan was to follow-up when due for her yearly CPE. I am assuming she received an EOB which is where the concern and frustration is coming from -- again I apologize for any misunderstanding. I see she has our insurance so I would not think she would receive a bill for the visit regardless, but please look into this for me. I can always get her in for labs if needed. As far as adjusting coding for the visit -- we would need to get input from [place]. I did do a ROS and broad exam as I do with all new patients to get a baseline.   Again please let her know delayed response was due to provider not being in the clinic and message just now being routed to Engineer, building services. Hopefully we can get things taken care of.

## 2019-07-30 NOTE — Telephone Encounter (Signed)
Duplicate notes. Please see response in most recent note from 07/30/2019

## 2019-08-04 NOTE — Telephone Encounter (Signed)
Left message for patient to return call. This was not a physical appointment. It was a problem based new patient appointment.

## 2019-08-15 NOTE — Telephone Encounter (Signed)
Geoffry Paradise called our office at [facility] requesting to speak with Lea. Pt is wanting to discuss issue with new patient appointment as noted below. Pt states she was charged for a new patient appointment instead of physical. Pt asks to give her a call at [phone].

## 2019-08-18 ENCOUNTER — Encounter: Payer: Self-pay | Admitting: Physician Assistant

## 2019-08-18 ENCOUNTER — Telehealth: Payer: Self-pay | Admitting: Physician Assistant

## 2019-08-18 NOTE — Telephone Encounter (Signed)
Nikko Mcfadyen is calling again - She received a bill for an appt she had to establish care.  She was told that the appt was for anxiety.  She is confused about this and wants to know how to fix this misunderstanding, which she is hoping will also fix the amount she was billed.  She would like a call at your convenience.  Call back number - [phone]

## 2019-08-19 NOTE — Telephone Encounter (Signed)
I was able to connect with this patient on 08/18/19 to discuss her billing concerns. I explained to her that we would be unable to change the billing for her visit. I explained that it was documented as a problem visit and not a preventative visit and that these charges went towards her deductible.  She did not agree and felt that she should  have been charged for a preventative visit. I explained that we are unable to change coding to something that the documentation does not support. I did explain that changing coding to a preventative visit that is not documented would be insurance fraud. I explained to her that I would have coding review it and explained that the provider had already reviewed this as well.  This is the explanation that  Dawn in coding sent back to me after reviewing: Jessica Murphy, Jessica Murphy  Hi Jessica,   It is coded correct according to documentation.  She had one stable chronic and rx mgmt which meets mdm requirements for [phone].     We can't change level of service since documentation supports and a cpe was not performed."  The patient still did not agree with this and still asked for this to be changed. She then stated that she would send a mychart message to the provider.

## 2019-08-19 NOTE — Telephone Encounter (Signed)
Documented in original telephone encounter.

## 2019-08-19 NOTE — Telephone Encounter (Signed)
Yes patient was aware that we would not be changing to a preventive LOS as a wellness/CPE was not done at time of visit. I let her know this when she messaged this morning. If coding is saying that it cannot be changed from [phone] back to [phone] (which is the LOS I put in at time of visit) then there is nothing more that I am able to do personally regarding this and would be at the discretion of billing/coding.

## 2019-08-25 ENCOUNTER — Encounter: Payer: Self-pay | Admitting: Obstetrics and Gynecology

## 2019-08-25 ENCOUNTER — Other Ambulatory Visit: Payer: Self-pay

## 2019-08-25 ENCOUNTER — Ambulatory Visit (INDEPENDENT_AMBULATORY_CARE_PROVIDER_SITE_OTHER): Payer: 59 | Admitting: Obstetrics and Gynecology

## 2019-08-25 VITALS — BP 132/80 | Ht 64.0 in | Wt 147.0 lb

## 2019-08-25 DIAGNOSIS — N61 Mastitis without abscess: Secondary | ICD-10-CM | POA: Diagnosis not present

## 2019-08-25 MED ORDER — DICLOXACILLIN SODIUM 500 MG PO CAPS: 500.0000 mg | ORAL_CAPSULE | Freq: Four times a day (QID) | ORAL | 0 refills | Status: AC

## 2019-08-25 NOTE — Patient Instructions (Signed)
Mastitis  Mastitis is inflammation of the breast tissue. It can occur in women who are breastfeeding. This can make breastfeeding painful. Mastitis will sometimes go away on its own, especially if it is not caused by an infection (non-infectious mastitis). Your health care provider will help determine if medical treatment is needed. Treatment may be needed if the condition is caused by a bacterial infection (infectious mastitis). What are the causes? This condition is often associated with a blocked milkduct, which can happen when too much milk builds up in the breast. Causes of excess milk in the breast can include:  Poor latch-on. If your baby is not latched onto the breast properly, he or she may not empty your breast completely while breastfeeding.  Allowing too much time to pass between feedings.  Wearing a bra or other clothing that is too tight. This puts extra pressure on the milk ducts so milk does not flow through them as it should.  Milk remaining in the breast because it is overfilled (engorged).  Stress and fatigue. Mastitis can also be caused by a bacterial infection. Bacteria may enter the breast tissue through cuts, cracks, or openings in the skin near the nipple area. Cracks in the skin are often caused when your baby does not latch on properly to the breast. What are the signs or symptoms? Symptoms of this condition include:  Swelling, redness, tenderness, and pain in an area of the breast. This usually affects the upper part of the breast, toward the armpit region. In most cases, it affects only one breast. In some cases, it may occur on both breasts at the same time and affect a larger portion of breast tissue.  Swelling of the glands under the arm on the same side.  Fatigue, headache, and flu-like muscle aches.  Fever.  Rapid pulse. Symptoms usually last 2 to 5 days. Breast pain and redness are at their worst on day 2 and day 3, and they usually go away by day 5. If  an infection is left to progress, a collection of pus (abscess) may develop. How is this diagnosed? This condition can be diagnosed based on your symptoms and a physical exam. You may also have tests, such as:  Blood tests to determine if your body is fighting a bacterial infection.  Mammogram or ultrasound tests to rule out other problems or diseases.  Fluid tests. If an abscess has developed, the fluid in the abscess may be removed with a needle. The fluid may be analyzed to determine if bacteria are present.  Breast milk may be cultured and tested for bacteria. How is this treated? This condition will sometimes go away on its own. Your health care provider may choose to wait 24 hours after first seeing you to decide whether treatment is needed. If treatment is needed, it may include:  Strategies to manage breastfeeding. This includes continuing to breastfeed or pump in order to allow adequate milk flow, using breast massage, and applying heat or cold to the affected area.  Self-care such as rest and increased fluid intake.  Medicine for pain.  Antibiotic medicine to treat a bacterial infection. This is usually taken by mouth.  If an abscess has developed, it may be treated by removing fluid with a needle. Follow these instructions at home: Medicines  Take over-the-counter and prescription medicines only as told by your health care provider.  If you were prescribed an antibiotic medicine, take it as told by your health care provider. Do not stop  taking the antibiotic even if you start to feel better. General instructions  Do not wear a tight or underwire bra. Wear a soft, supportive bra.  Increase your fluid intake, especially if you have a fever.  Get plenty of rest. For breastfeeding:  Continue to empty your breasts as often as possible, either by breastfeeding or using an electric breast pump. This will lower the pressure and the pain that comes with it. Ask your health  care provider if changes need to be made to your breastfeeding or pumping routine.  Keep your nipples clean and dry.  During breastfeeding, empty the first breast completely before going to the other breast. If your baby is not emptying your breasts completely, use a breast pump to empty your breasts.  Use breast massage during feeding or pumping sessions.  If directed, apply moist heat to the affected area of your breast right before breastfeeding or pumping. Use the heat source that your health care provider recommends.  If directed, put ice on the affected area of your breast right after breastfeeding or pumping: ? Put ice in a plastic bag. ? Place a towel between your skin and the bag. ? Leave the ice on for 20 minutes.  If you go back to work, pump your breasts while at work to stay in time with your nursing schedule.  Do not allow your breasts to become engorged. Contact a health care provider if:  You have pus-like discharge from the breast.  You have a fever.  Your symptoms do not improve within 2 days of starting treatment.  Your symptoms return after you have recovered from a breast infection. Get help right away if:  Your pain and swelling are getting worse.  You have pain that is not controlled with medicine.  You have a red line extending from the breast toward your armpit. Summary  Mastitis is inflammation of the breast tissue. It is often caused by a blocked milk duct or bacteria.  This condition may be treated with hot and cold compresses, medicines, self-care, and certain breastfeeding strategies.  If you were prescribed an antibiotic medicine, take it as told by your health care provider. Do not stop taking the antibiotic even if you start to feel better.  Continue to empty your breasts as often as possible either by breastfeeding or using an electric breast pump. This information is not intended to replace advice given to you by your health care provider.  Make sure you discuss any questions you have with your health care provider. Document Revised: 01/11/2018 Document Reviewed: 04/25/2016 Elsevier Patient Education  [place].

## 2019-08-25 NOTE — Progress Notes (Signed)
Patient ID: Jessica Murphy, female   DOB: Sep 27, 1975, 44 y.o.   MRN: [id number]  Reason for Consult: Breast Problem (left side)   Referred by Brunetta Jeans, PA-C  Subjective:     HPI:  Jessica Murphy is a 44 y.o. female.  She presents today with complaints of tenderness and redness of her left breast.  She reports that over the weekend she has noticed progressive symptoms which started as tenderness and she has now noticed fevers and body aches at home.  She reports that she has a small area of redness on the left breast.  She reports that she stayed in bed for 3 hours this morning after her children went to school which is unusual for her.  She denies any drainage from the nipple.  She denies recent trauma to the breast.  The symptoms started while she was at the beach and worsened.  She has not previously tried any therapies.  She is concerned regarding possible breast cancer because her friend, who is 20, was recently diagnosed with cancer.  She had a mammogram in December which was normal, BI-RADS 1.  She reports that with breast-feeding her 3 children she never had mastitis.   Past Medical History:  Diagnosis Date  . Blood type O-   . Complication of anesthesia    bp dropped with 1st spinal  . Menorrhagia   . Spontaneous abortion    END OF 2011/2012, 2017  . Umbilical hernia   . Uterine fibroid    Family History  Problem Relation Age of Onset  . Congestive Heart Failure Mother   . Hypertension Mother   . Thyroid disease Mother        HYPO  . Lupus Mother        ERYTHEMATOSUS  . Rheum arthritis Mother   . Sjogren's syndrome Mother   . Colitis Mother        collagenous  . Alcohol abuse Father        DECEASED Aug 15, 2009  . Cancer Maternal Grandmother 58       COLON  . Heart disease Maternal Grandmother   . Thyroid disease Maternal Grandmother   . Heart disease Maternal Grandfather   . Diabetes Paternal Grandfather        TYPE 2  . Heart disease Paternal  Grandfather   . Cancer Cousin 42       BREAST  . Alcohol abuse Cousin 43  . Diabetes Son        TYPE 1  . Thyroid disease Maternal Aunt   . Breast cancer Cousin    Past Surgical History:  Procedure Laterality Date  . CESAREAN SECTION     X3  . HYSTEROSCOPY  03/25/2009   HYSTEROSCOPIC IUD REMOVAL  . LAPAROSCOPIC HYSTERECTOMY N/A 11/28/2018   Procedure: HYSTERECTOMY TOTAL LAPAROSCOPIC;  Surgeon: Malachy Mood, MD;  Location: [facility];  Service: Gynecology;  Laterality: N/A;  . LAPAROSCOPY N/A 11/30/2018   Procedure: LAPAROSCOPY OPERATIVE,cystoscopy,bladder repair,retrograde pyelogram;  Surgeon: Gae Dry, MD;  Location: [facility];  Service: Gynecology;  Laterality: N/A;  . UMBILICAL HERNIA REPAIR N/A 11/28/2018   Procedure: HERNIA REPAIR UMBILICAL ADULT;  Surgeon: Jules Husbands, MD;  Location: [facility];  Service: General;  Laterality: N/A;  . WISDOM TOOTH EXTRACTION      Short Social History:  Social History   Tobacco Use  . Smoking status: Former Smoker    Types: Cigarettes    Quit date: 11/18/2000    Years  since quitting: 18.7  . Smokeless tobacco: Never Used  . Tobacco comment: more socailly  Substance Use Topics  . Alcohol use: Yes    Comment: occ    Allergies  Allergen Reactions  . Guaifenesin Palpitations  . Bactrim [Sulfamethoxazole-Trimethoprim]     Yeast infection and diarrhea  . Erythromycin Nausea And Vomiting    Current Outpatient Medications  Medication Sig Dispense Refill  . Cholecalciferol (VITAMIN D) 50 MCG (2000 UT) tablet Take 2,000 Units by mouth 3 (three) times a week.    . clonazePAM (KLONOPIN) 0.5 MG tablet Take 0.25 mg by mouth daily as needed for anxiety.    Marland Kitchen escitalopram (LEXAPRO) 10 MG tablet Take 0.5 tablets (5 mg total) by mouth daily. 45 tablet 3  . ibuprofen (ADVIL) 200 MG tablet Take 200 mg by mouth as needed.    . vitamin B-12 (CYANOCOBALAMIN) 1000 MCG tablet Take 1,000 mcg by mouth daily.    . dicloxacillin (DYNAPEN) 500 MG  capsule Take 1 capsule (500 mg total) by mouth 4 (four) times daily for 10 days. 40 capsule 0   No current facility-administered medications for this visit.    Review of Systems  Constitutional: Positive for fatigue and fever. Negative for chills and unexpected weight change.  HENT: Negative for trouble swallowing.  Eyes: Negative for loss of vision.  Respiratory: Negative for cough, shortness of breath and wheezing.  Cardiovascular: Negative for chest pain, leg swelling, palpitations and syncope.  GI: Negative for abdominal pain, blood in stool, diarrhea, nausea and vomiting.  GU: Negative for difficulty urinating, dysuria, frequency and hematuria.  Musculoskeletal: Negative for back pain, leg pain and joint pain.  Skin: Negative for rash.  Neurological: Negative for dizziness, headaches, light-headedness, numbness and seizures.  Psychiatric: Negative for behavioral problem, confusion, depressed mood and sleep disturbance.        Objective:  Objective   Vitals:   08/25/19 1625  BP: 132/80  Weight: 147 lb (66.7 kg)  Height: 5\' 4"  (1.626 m)   Body mass index is 25.23 kg/m.  Physical Exam Vitals and nursing note reviewed. Exam conducted with a chaperone present.  Constitutional:      Appearance: She is well-developed.  HENT:     Head: Normocephalic and atraumatic.  Eyes:     Pupils: Pupils are equal, round, and reactive to light.  Cardiovascular:     Rate and Rhythm: Normal rate and regular rhythm.  Pulmonary:     Effort: Pulmonary effort is normal. No respiratory distress.  Chest:     Chest wall: Tenderness present. No mass, lacerations, deformity, swelling, crepitus or edema.     Breasts: Breasts are symmetrical.        Right: Normal. No swelling, bleeding, inverted nipple, mass, nipple discharge, skin change or tenderness.        Left: Skin change and tenderness present. No swelling, bleeding, inverted nipple, mass or nipple discharge.    Abdominal:     General:  Abdomen is flat. There is no distension.     Palpations: Abdomen is soft.  Lymphadenopathy:     Upper Body:     Right upper body: No supraclavicular or axillary adenopathy.     Left upper body: No supraclavicular or axillary adenopathy.  Skin:    General: Skin is warm and dry.  Neurological:     Mental Status: She is alert and oriented to person, place, and time.  Psychiatric:        Behavior: Behavior normal.  Thought Content: Thought content normal.        Judgment: Judgment normal.         Assessment/Plan:    44 year old with mastitis Will treat with 10 days of dicloaxacillin.  If she does not have improvement in her mastitis will obtain further imaging.  We will plan on follow-up in 2 weeks.  More than 20 minutes were spent face to face with the patient in the room, reviewing the medical record, labs and images, and coordinating care for the patient. The plan of management was discussed in detail and counseling was provided.    Adrian Prows MD [facility], [facility] 08/25/2019 5:07 PM

## 2019-09-04 ENCOUNTER — Other Ambulatory Visit: Payer: Self-pay | Admitting: Obstetrics and Gynecology

## 2019-09-04 DIAGNOSIS — N3000 Acute cystitis without hematuria: Secondary | ICD-10-CM

## 2019-09-04 MED ORDER — FLUCONAZOLE 150 MG PO TABS: 150.0000 mg | ORAL_TABLET | Freq: Once | ORAL | 0 refills | Status: AC

## 2019-09-04 MED ORDER — NITROFURANTOIN MONOHYD MACRO 100 MG PO CAPS: 100.0000 mg | ORAL_CAPSULE | Freq: Two times a day (BID) | ORAL | 0 refills | Status: DC

## 2019-09-04 MED FILL — FLUCONAZOLE 150 MG TABLET: 150 | 3 days supply | Qty: 2 | Fill #0

## 2019-09-05 ENCOUNTER — Other Ambulatory Visit: Payer: Self-pay

## 2019-09-05 DIAGNOSIS — N3000 Acute cystitis without hematuria: Secondary | ICD-10-CM

## 2019-09-05 MED ORDER — NITROFURANTOIN MONOHYD MACRO 100 MG PO CAPS: 100.0000 mg | ORAL_CAPSULE | Freq: Two times a day (BID) | ORAL | 0 refills | Status: DC

## 2019-09-05 NOTE — Telephone Encounter (Signed)
Dr Gilman Schmidt sent med's to [facility] for pt lastnight

## 2019-09-08 ENCOUNTER — Ambulatory Visit (INDEPENDENT_AMBULATORY_CARE_PROVIDER_SITE_OTHER): Payer: 59 | Admitting: Obstetrics and Gynecology

## 2019-09-08 ENCOUNTER — Other Ambulatory Visit: Payer: Self-pay

## 2019-09-08 ENCOUNTER — Encounter: Payer: Self-pay | Admitting: Obstetrics and Gynecology

## 2019-09-08 VITALS — BP 124/76 | Wt 145.0 lb

## 2019-09-08 DIAGNOSIS — N61 Mastitis without abscess: Secondary | ICD-10-CM

## 2019-09-08 DIAGNOSIS — N390 Urinary tract infection, site not specified: Secondary | ICD-10-CM | POA: Diagnosis not present

## 2019-09-08 MED ORDER — NITROFURANTOIN MONOHYD MACRO 100 MG PO CAPS
100.0000 mg | ORAL_CAPSULE | Freq: Two times a day (BID) | ORAL | 5 refills | Status: AC
Start: 2019-09-08 — End: 2019-09-13

## 2019-09-08 NOTE — Progress Notes (Signed)
Obstetrics & Gynecology Office Visit   Chief Complaint:  Chief Complaint  Patient presents with  . Follow-up    Left breast pain, finished abx 4/29    History of Present Illness: 44 y.o. [id number] presenting for follow up of non-lactational left mastitis.  No clear trauma or inciting event although she reports he dog did jump on her and that may be the initial event.  Following completion of antibiotics the patient reports resolution of symptoms.  No residual symptoms or skin changes with completion of antibiotic course on 4/29.  She did develop vaginal candida symptoms and was treated with diflucan which resulted in cessation of symptoms.    Also inquires about recurrent UTI treatment options.     Review of Systems: Review of Systems  Constitutional: Negative.   Gastrointestinal: Negative.   Genitourinary: Negative.      Past Medical History:  Past Medical History:  Diagnosis Date  . Blood type O-   . Complication of anesthesia    bp dropped with 1st spinal  . Menorrhagia   . Spontaneous abortion    END OF 2011/2012, 2017  . Umbilical hernia   . Uterine fibroid     Past Surgical History:  Past Surgical History:  Procedure Laterality Date  . CESAREAN SECTION     X3  . HYSTEROSCOPY  03/25/2009   HYSTEROSCOPIC IUD REMOVAL  . LAPAROSCOPIC HYSTERECTOMY N/A 11/28/2018   Procedure: HYSTERECTOMY TOTAL LAPAROSCOPIC;  Surgeon: Malachy Mood, MD;  Location: [facility];  Service: Gynecology;  Laterality: N/A;  . LAPAROSCOPY N/A 11/30/2018   Procedure: LAPAROSCOPY OPERATIVE,cystoscopy,bladder repair,retrograde pyelogram;  Surgeon: Gae Dry, MD;  Location: [facility];  Service: Gynecology;  Laterality: N/A;  . UMBILICAL HERNIA REPAIR N/A 11/28/2018   Procedure: HERNIA REPAIR UMBILICAL ADULT;  Surgeon: Jules Husbands, MD;  Location: [facility];  Service: General;  Laterality: N/A;  . WISDOM TOOTH EXTRACTION      Gynecologic History: Patient's last menstrual period was  11/17/2018 (exact date).  Obstetric History: [id number]  Family History:  Family History  Problem Relation Age of Onset  . Congestive Heart Failure Mother   . Hypertension Mother   . Thyroid disease Mother        HYPO  . Lupus Mother        ERYTHEMATOSUS  . Rheum arthritis Mother   . Sjogren's syndrome Mother   . Colitis Mother        collagenous  . Alcohol abuse Father        DECEASED 2009-08-27  . Cancer Maternal Grandmother 58       COLON  . Heart disease Maternal Grandmother   . Thyroid disease Maternal Grandmother   . Heart disease Maternal Grandfather   . Diabetes Paternal Grandfather        TYPE 2  . Heart disease Paternal Grandfather   . Cancer Cousin 42       BREAST  . Alcohol abuse Cousin 43  . Diabetes Son        TYPE 1  . Thyroid disease Maternal Aunt   . Breast cancer Cousin     Social History:  Social History   Socioeconomic History  . Marital status: Married    Spouse name: Not on file  . Number of children: 3  . Years of education: 65  . Highest education level: Not on file  Occupational History  . Occupation: NURSE    Comment: [employer]  Tobacco Use  . Smoking  status: Former Smoker    Types: Cigarettes    Quit date: 11/18/2000    Years since quitting: 18.8  . Smokeless tobacco: Never Used  . Tobacco comment: more socailly  Substance and Sexual Activity  . Alcohol use: Yes    Comment: occ  . Drug use: No  . Sexual activity: Yes    Partners: Male    Birth control/protection: None  Other Topics Concern  . Not on file  Social History Narrative  . Not on file   Social Determinants of Health   Financial Resource Strain:   . Difficulty of Paying Living Expenses:   Food Insecurity:   . Worried About Charity fundraiser in the Last Year:   . Arboriculturist in the Last Year:   Transportation Needs:   . Film/video editor (Medical):   Marland Kitchen Lack of Transportation (Non-Medical):   Physical Activity:   . Days of Exercise per Week:     . Minutes of Exercise per Session:   Stress:   . Feeling of Stress :   Social Connections:   . Frequency of Communication with Friends and Family:   . Frequency of Social Gatherings with Friends and Family:   . Attends Religious Services:   . Active Member of Clubs or Organizations:   . Attends Archivist Meetings:   Marland Kitchen Marital Status:   Intimate Partner Violence:   . Fear of Current or Ex-Partner:   . Emotionally Abused:   Marland Kitchen Physically Abused:   . Sexually Abused:     Allergies:  Allergies  Allergen Reactions  . Guaifenesin Palpitations  . Bactrim [Sulfamethoxazole-Trimethoprim]     Yeast infection and diarrhea  . Erythromycin Nausea And Vomiting    Medications: Prior to Admission medications   Medication Sig Start Date End Date Taking? Authorizing Provider  Cholecalciferol (VITAMIN D) 50 MCG (2000 UT) tablet Take 2,000 Units by mouth 3 (three) times a week.   Yes [provider]  clonazePAM (KLONOPIN) 0.5 MG tablet Take 0.25 mg by mouth daily as needed for anxiety. 09/19/18  Yes [provider]  escitalopram (LEXAPRO) 10 MG tablet Take 0.5 tablets (5 mg total) by mouth daily. 06/19/19  Yes Brunetta Jeans, PA-C  ibuprofen (ADVIL) 200 MG tablet Take 200 mg by mouth as needed.   Yes [provider]  nitrofurantoin, macrocrystal-monohydrate, (MACROBID) 100 MG capsule Take 1 capsule (100 mg total) by mouth 2 (two) times daily for 5 days. 09/05/19 09/10/19 Yes Malachy Mood, MD  vitamin B-12 (CYANOCOBALAMIN) 1000 MCG tablet Take 1,000 mcg by mouth daily.   Yes [provider]    Physical Exam Vitals:  Vitals:   09/08/19 1051  BP: 124/76   Patient's last menstrual period was 11/17/2018 (exact date).  General: NAD HEENT: normocephalic, anicteric Pulmonary: No increased work of breathing Breast symmetrical, no tenderness, no palpable nodules or masses, no skin or nipple retraction present, no nipple discharge.  No axillary or  supraclavicular lymphadenopathy.  Extremities: no edema, erythema, or tenderness Neurologic: Grossly intact Psychiatric: mood appropriate, affect full  Female chaperone present for breast portions of the physical exam  Assessment: 44 y.o. [id number] follow up non-lactational mastitis  Plan: Problem List Items Addressed This Visit    None     1) Mastitis - complete resolution on residual symptoms, normal BI-RAD I mammogram 04/2019  2) Recurrent UTI   Definition of recurrent urinary tract  infections is 2 culture proven  episodes in 6 months, or  3 culture  proven episodes in 1 year with individual episodes separated by at  least two weeks or documentation of differing urinary tract pathogen.   Relapse of urinary tract infection is symptoms within 2 weeks of adequate treatment, or recurrence of symptoms within 2 weeks and documentation of the same causative urinary tract pathogen.  Museum/gallery exhibitions officer Society Best-Practice Statement: Recurrent Urinary Tract Infection in Adult Women" Female Pelvic Medicine & Reconstructive Surgery Volume 24, Number 5, September/October 2018  - patient opts for standing order macrobid as opposed to daily suppresion, should symptoms ever fail to improve discussed urine culture would be indicated  3) Return in about 7 months (around 04/09/2020) for annual.   Malachy Mood, MD, [place], [place] 09/08/2019, 10:59 AM

## 2019-09-15 DIAGNOSIS — E559 Vitamin D deficiency, unspecified: Secondary | ICD-10-CM | POA: Diagnosis not present

## 2019-09-15 DIAGNOSIS — Z1211 Encounter for screening for malignant neoplasm of colon: Secondary | ICD-10-CM | POA: Diagnosis not present

## 2019-09-15 DIAGNOSIS — Z13220 Encounter for screening for lipoid disorders: Secondary | ICD-10-CM | POA: Diagnosis not present

## 2019-09-15 DIAGNOSIS — F419 Anxiety disorder, unspecified: Secondary | ICD-10-CM | POA: Diagnosis not present

## 2019-09-15 DIAGNOSIS — Z0000 Encounter for general adult medical examination without abnormal findings: Secondary | ICD-10-CM | POA: Diagnosis not present

## 2019-10-23 DIAGNOSIS — Z800 Family history of malignant neoplasm of digestive organs: Secondary | ICD-10-CM | POA: Diagnosis not present

## 2019-10-23 DIAGNOSIS — R140 Abdominal distension (gaseous): Secondary | ICD-10-CM | POA: Diagnosis not present

## 2019-10-23 DIAGNOSIS — K5909 Other constipation: Secondary | ICD-10-CM | POA: Diagnosis not present

## 2019-10-23 DIAGNOSIS — Z1211 Encounter for screening for malignant neoplasm of colon: Secondary | ICD-10-CM | POA: Diagnosis not present

## 2019-11-10 MED FILL — ESCITALOPRAM 10 MG TABLET: 10 | 90 days supply | Qty: 45 | Fill #1

## 2019-11-13 DIAGNOSIS — M25552 Pain in left hip: Secondary | ICD-10-CM | POA: Diagnosis not present

## 2019-12-25 DIAGNOSIS — L814 Other melanin hyperpigmentation: Secondary | ICD-10-CM | POA: Diagnosis not present

## 2019-12-25 DIAGNOSIS — L821 Other seborrheic keratosis: Secondary | ICD-10-CM | POA: Diagnosis not present

## 2019-12-25 DIAGNOSIS — D225 Melanocytic nevi of trunk: Secondary | ICD-10-CM | POA: Diagnosis not present

## 2019-12-25 DIAGNOSIS — D1801 Hemangioma of skin and subcutaneous tissue: Secondary | ICD-10-CM | POA: Diagnosis not present

## 2019-12-25 DIAGNOSIS — L578 Other skin changes due to chronic exposure to nonionizing radiation: Secondary | ICD-10-CM | POA: Diagnosis not present

## 2020-01-08 DIAGNOSIS — H16041 Marginal corneal ulcer, right eye: Secondary | ICD-10-CM | POA: Diagnosis not present

## 2020-01-08 MED FILL — ZYLET EYE DROPS: 0.5-0.3 | 25 days supply | Qty: 5 | Fill #0

## 2020-03-11 MED FILL — ESCITALOPRAM 10 MG TABLET: 10 | 90 days supply | Qty: 45 | Fill #2

## 2020-03-17 ENCOUNTER — Other Ambulatory Visit: Payer: Self-pay

## 2020-03-17 ENCOUNTER — Other Ambulatory Visit: Payer: Self-pay | Admitting: Podiatry

## 2020-03-17 ENCOUNTER — Encounter: Payer: Self-pay | Admitting: Podiatry

## 2020-03-17 ENCOUNTER — Ambulatory Visit (INDEPENDENT_AMBULATORY_CARE_PROVIDER_SITE_OTHER): Payer: 59

## 2020-03-17 ENCOUNTER — Ambulatory Visit (INDEPENDENT_AMBULATORY_CARE_PROVIDER_SITE_OTHER): Payer: 59 | Admitting: Podiatry

## 2020-03-17 DIAGNOSIS — M722 Plantar fascial fibromatosis: Secondary | ICD-10-CM

## 2020-03-17 DIAGNOSIS — M7732 Calcaneal spur, left foot: Secondary | ICD-10-CM | POA: Diagnosis not present

## 2020-03-17 DIAGNOSIS — M79672 Pain in left foot: Secondary | ICD-10-CM

## 2020-03-17 MED ORDER — MELOXICAM 7.5 MG PO TABS: 7.5000 mg | ORAL_TABLET | Freq: Every day | ORAL | 0 refills | Status: DC

## 2020-03-17 MED FILL — MELOXICAM 7.5 MG TABLET: 7.5 | 30 days supply | Qty: 30 | Fill #0

## 2020-03-17 NOTE — Patient Instructions (Signed)
Discontinue ibuprofen.   Oofos recovery shoes: [email or web address].com  Plantar Fasciitis  Plantar fasciitis is a painful foot condition that affects the heel. It occurs when the band of tissue that connects the toes to the heel bone (plantar fascia) becomes irritated. This can happen as the result of exercising too much or doing other repetitive activities (overuse injury). The pain from plantar fasciitis can range from mild irritation to severe pain that makes it difficult to walk or move. The pain is usually worse in the morning after sleeping, or after sitting or lying down for a while. Pain may also be worse after long periods of walking or standing. What are the causes? This condition may be caused by:  Standing for long periods of time.  Wearing shoes that do not have good arch support.  Doing activities that put stress on joints (high-impact activities), including running, aerobics, and ballet.  Being overweight.  An abnormal way of walking (gait).  Tight muscles in the back of your lower leg (calf).  High arches in your feet.  Starting a new athletic activity. What are the signs or symptoms? The main symptom of this condition is heel pain. Pain may:  Be worse with first steps after a time of rest, especially in the morning after sleeping or after you have been sitting or lying down for a while.  Be worse after long periods of standing still.  Decrease after 30-45 minutes of activity, such as gentle walking. How is this diagnosed? This condition may be diagnosed based on your medical history and your symptoms. Your health care provider may ask questions about your activity level. Your health care provider will do a physical exam to check for:  A tender area on the bottom of your foot.  A high arch in your foot.  Pain when you move your foot.  Difficulty moving your foot. You may have imaging tests to confirm the diagnosis, such as:  X-rays.  Ultrasound.  MRI. How is  this treated? Treatment for plantar fasciitis depends on how severe your condition is. Treatment may include:  Rest, ice, applying pressure (compression), and raising the affected foot (elevation). This may be called RICE therapy. Your health care provider may recommend RICE therapy along with over-the-counter pain medicines to manage your pain.  Exercises to stretch your calves and your plantar fascia.  A splint that holds your foot in a stretched, upward position while you sleep (night splint).  Physical therapy to relieve symptoms and prevent problems in the future.  Injections of steroid medicine (cortisone) to relieve pain and inflammation.  Stimulating your plantar fascia with electrical impulses (extracorporeal shock wave therapy). This is usually the last treatment option before surgery.  Surgery, if other treatments have not worked after 12 months. Follow these instructions at home:  Managing pain, stiffness, and swelling  If directed, put ice on the painful area: ? Put ice in a plastic bag, or use a frozen bottle of water. ? Place a towel between your skin and the bag or bottle. ? Roll the bottom of your foot over the bag or bottle. ? Do this for 20 minutes, 2-3 times a day.  Wear athletic shoes that have air-sole or gel-sole cushions, or try wearing soft shoe inserts that are designed for plantar fasciitis.  Raise (elevate) your foot above the level of your heart while you are sitting or lying down. Activity  Avoid activities that cause pain. Ask your health care provider what activities are safe for you.  Do physical therapy exercises and stretches as told by your health care provider.  Try activities and forms of exercise that are easier on your joints (low-impact). Examples include swimming, water aerobics, and biking. General instructions  Take over-the-counter and prescription medicines only as told by your health care provider.  Wear a night splint while  sleeping, if told by your health care provider. Loosen the splint if your toes tingle, become numb, or turn cold and blue.  Maintain a healthy weight, or work with your health care provider to lose weight as needed.  Keep all follow-up visits as told by your health care provider. This is important. Contact a health care provider if you:  Have symptoms that do not go away after caring for yourself at home.  Have pain that gets worse.  Have pain that affects your ability to move or do your daily activities. Summary  Plantar fasciitis is a painful foot condition that affects the heel. It occurs when the band of tissue that connects the toes to the heel bone (plantar fascia) becomes irritated.  The main symptom of this condition is heel pain that may be worse after exercising too much or standing still for a long time.  Treatment varies, but it usually starts with rest, ice, compression, and elevation (RICE therapy) and over-the-counter medicines to manage pain. This information is not intended to replace advice given to you by your health care provider. Make sure you discuss any questions you have with your health care provider. Document Revised: 04/06/2017 Document Reviewed: 02/19/2017 Elsevier Patient Education  2020 [employer].

## 2020-03-17 NOTE — Progress Notes (Signed)
SUBJECTIVE: Jessica Murphy presents to clinic on today with cc of painful left foot.  Patient admits pain with first step in the morning and after periods of rest. Duration of pain is 5 months.  On a scale of 1-10, patient relates pain level is a 10 . Prior treatments include physical therapy, ice bottle, ibuprofen---all with no relief.  Past Medical History:  Diagnosis Date  . Blood type O-   . Complication of anesthesia    bp dropped with 1st spinal  . Menorrhagia   . Spontaneous abortion    END OF 2011/2012, 2017  . Umbilical hernia   . Uterine fibroid      Patient Active Problem List   Diagnosis Date Noted  . Anxiety 06/19/2019  . Intraoperative injury of bladder 11/30/2018  . Intraoperative bladder injury 11/30/2018  . S/P hysterectomy 11/28/2018  . Family history of colon cancer 12/04/2017  . Uterine fibroid   . Umbilical hernia   . Menorrhagia   . Blood type O-      Past Surgical History:  Procedure Laterality Date  . CESAREAN SECTION     X3  . HYSTEROSCOPY  03/25/2009   HYSTEROSCOPIC IUD REMOVAL  . LAPAROSCOPIC HYSTERECTOMY N/A 11/28/2018   Procedure: HYSTERECTOMY TOTAL LAPAROSCOPIC;  Surgeon: Malachy Mood, MD;  Location: [facility];  Service: Gynecology;  Laterality: N/A;  . LAPAROSCOPY N/A 11/30/2018   Procedure: LAPAROSCOPY OPERATIVE,cystoscopy,bladder repair,retrograde pyelogram;  Surgeon: Gae Dry, MD;  Location: [facility];  Service: Gynecology;  Laterality: N/A;  . UMBILICAL HERNIA REPAIR N/A 11/28/2018   Procedure: HERNIA REPAIR UMBILICAL ADULT;  Surgeon: Jules Husbands, MD;  Location: [facility];  Service: General;  Laterality: N/A;  . WISDOM TOOTH EXTRACTION        Current Outpatient Medications:  .  Cholecalciferol (VITAMIN D) 50 MCG (2000 UT) tablet, Take 2,000 Units by mouth 3 (three) times a week., Disp: , Rfl:  .  clonazePAM (KLONOPIN) 0.5 MG tablet, Take 0.25 mg by mouth daily as needed for anxiety., Disp: , Rfl:  .  escitalopram (LEXAPRO)  10 MG tablet, Take 0.5 tablets (5 mg total) by mouth daily., Disp: 45 tablet, Rfl: 3 .  ibuprofen (ADVIL) 200 MG tablet, Take 200 mg by mouth as needed., Disp: , Rfl:  .  meloxicam (MOBIC) 7.5 MG tablet, Take 1 tablet (7.5 mg total) by mouth daily., Disp: 30 tablet, Rfl: 0 .  PFIZER-BIONTECH COVID-19 VACC 30 MCG/0.3ML injection, , Disp: , Rfl:  .  vitamin B-12 (CYANOCOBALAMIN) 1000 MCG tablet, Take 1,000 mcg by mouth daily., Disp: , Rfl:  .  ZYLET 0.5-0.3 % SUSP, Place into the right eye., Disp: , Rfl:    Allergies  Allergen Reactions  . Guaifenesin Palpitations  . Bactrim [Sulfamethoxazole-Trimethoprim]     Yeast infection and diarrhea  . Erythromycin Nausea And Vomiting     Social History   Occupational History  . Occupation: NURSE    Comment: [employer]  Tobacco Use  . Smoking status: Former Smoker    Types: Cigarettes    Quit date: 11/18/2000    Years since quitting: 19.3  . Smokeless tobacco: Never Used  . Tobacco comment: more socailly  Vaping Use  . Vaping Use: Never used  Substance and Sexual Activity  . Alcohol use: Yes    Comment: occ  . Drug use: No  . Sexual activity: Yes    Partners: Male    Birth control/protection: None     Family History  Problem  Relation Age of Onset  . Congestive Heart Failure Mother   . Hypertension Mother   . Thyroid disease Mother        HYPO  . Lupus Mother        ERYTHEMATOSUS  . Rheum arthritis Mother   . Sjogren's syndrome Mother   . Colitis Mother        collagenous  . Alcohol abuse Father        DECEASED 08-21-09  . Cancer Maternal Grandmother 58       COLON  . Heart disease Maternal Grandmother   . Thyroid disease Maternal Grandmother   . Heart disease Maternal Grandfather   . Diabetes Paternal Grandfather        TYPE 2  . Heart disease Paternal Grandfather   . Cancer Cousin 42       BREAST  . Alcohol abuse Cousin 43  . Diabetes Son        TYPE 1  . Thyroid disease Maternal Aunt   . Breast cancer Cousin       Immunization History  Administered Date(s) Administered  . Influenza,inj,Quad PF,6+ Mos 01/15/2017  . Influenza-Unspecified 01/15/2017, 02/20/2018, 03/04/2019  . PFIZER SARS-COV-2 Vaccination 05/10/2019, 05/29/2019  . Tdap 05/10/2015    OBJECTIVE: There were no vitals filed for this visit.  Vascular Examination: Dorsalis pedis and posterior tibial pulses palpable bilaterally.  Capillary refill time immediate x 10 digits.  No pedal edema b/l.  No varicosities b/l.  Skin temperature gradient WNL b/l.  Dermatological: Normal skin turgor, texture and tone b/l.  No skin eruptions noted b/l.  Nails 1-5 b/l normal b/l, painted with white polish.  Musculoskeletal: Muscle strength 5/5 to all LE muscle groups b/l.  Negative Tinel's sign b/l.  Pain on palpation noted medial tubercle left heel.  Neurological: Epicritic sensation grossly intact b/l and symmetrically with 10 gram monofilament.  Vibratory sensation intact b/l.  Xray findings left foot: Normal bone mineralization. No gas in tissues. +Plantar calcaneal spur noted left foot  ASSESSMENT: 1. Pain of left heel   2. Plantar fasciitis of left foot   3. Heel spur, left    PLAN: 1. Patient examined today. 2. Xrays taken and discussed findings with patient. 3. Discussed plantar fasciitis. At this time a plantar fascial injection was recommended.  The patient agreed and a sterile skin prep was applied.  An injection consisting of 1 cc Celestone Soluspan and 1 cc 0.5% Marcaine plain mixture was infiltrated at the point of maximal tenderness on the left Heel.  The patient tolerated this well and was given instructions for aftercare.  4. Dispensed plantar fascial brace for left foot. 5. Will check benefits for orthotics. 6. Instructed patient to discontinue ibuprofen. Prescripton sent to patient's pharmacy for Mobic 7.5 mg. Take one tablet by mouth daily. 7. Dispensed literature on stretching exercises.  Return in  about 1 month (around 04/16/2020) for DR EVANS .  Marzetta Board, DPM

## 2020-04-09 DIAGNOSIS — R5383 Other fatigue: Secondary | ICD-10-CM | POA: Diagnosis not present

## 2020-04-19 ENCOUNTER — Telehealth: Payer: Self-pay | Admitting: Podiatry

## 2020-04-19 ENCOUNTER — Ambulatory Visit (INDEPENDENT_AMBULATORY_CARE_PROVIDER_SITE_OTHER): Payer: 59 | Admitting: Podiatry

## 2020-04-19 ENCOUNTER — Other Ambulatory Visit: Payer: Self-pay | Admitting: Podiatry

## 2020-04-19 ENCOUNTER — Other Ambulatory Visit: Payer: Self-pay

## 2020-04-19 DIAGNOSIS — M722 Plantar fascial fibromatosis: Secondary | ICD-10-CM | POA: Diagnosis not present

## 2020-04-19 MED ORDER — MELOXICAM 15 MG PO TABS: 15.0000 mg | ORAL_TABLET | Freq: Every day | ORAL | 1 refills | Status: DC

## 2020-04-19 MED ORDER — METHYLPREDNISOLONE 4 MG PO TBPK
ORAL_TABLET | ORAL | 0 refills | Status: DC
Start: 2020-04-19 — End: 2020-04-19

## 2020-04-19 MED FILL — MELOXICAM 15 MG TABLET: 15 | 30 days supply | Qty: 30 | Fill #0

## 2020-04-19 MED FILL — METHYLPREDNISOLONE 4 MG TBP: 4 | 6 days supply | Qty: 21 | Fill #0

## 2020-04-19 NOTE — Telephone Encounter (Signed)
Pt called and stated that she was suppose to get an injection for her heel but the toes and underneath the toes are numb. Heel is still in a lot of pain.

## 2020-04-19 NOTE — Telephone Encounter (Signed)
Pt states her toe and ball of foot is still numb after injection and would like to know next steps. Please advise.

## 2020-04-19 NOTE — Telephone Encounter (Signed)
Spoke with patient.

## 2020-04-21 DIAGNOSIS — M722 Plantar fascial fibromatosis: Secondary | ICD-10-CM | POA: Diagnosis not present

## 2020-04-21 MED ORDER — BETAMETHASONE SOD PHOS & ACET 6 (3-3) MG/ML IJ SUSP
3.0000 mg | Freq: Once | INTRAMUSCULAR | Status: AC
  Administered 2020-04-21: 3 mg via INTRAMUSCULAR

## 2020-04-21 NOTE — Progress Notes (Signed)
   Subjective: 44 y.o. female presenting today for follow-up evaluation of plantar fasciitis to the left foot.  Patient was seen by another physician here in the office and received a steroid injection on 03/17/2020.  Patient states she felt significant relief.  She presents for follow-up treatment evaluation   Past Medical History:  Diagnosis Date  . Blood type O-   . Complication of anesthesia    bp dropped with 1st spinal  . Menorrhagia   . Spontaneous abortion    END OF 2011/2012, 2017  . Umbilical hernia   . Uterine fibroid      Objective: Physical Exam General: The patient is alert and oriented x3 in no acute distress.  Dermatology: Skin is warm, dry and supple bilateral lower extremities. Negative for open lesions or macerations bilateral.   Vascular: Dorsalis Pedis and Posterior Tibial pulses palpable bilateral.  Capillary fill time is immediate to all digits.  Neurological: Epicritic and protective threshold intact bilateral.   Musculoskeletal: Tenderness to palpation to the plantar aspect of the left heel along the plantar fascia. All other joints range of motion within normal limits bilateral. Strength 5/5 in all groups bilateral.    Assessment: 1. Plantar fasciitis left foot  Plan of Care:  1. Patient evaluated.   2. Injection of 0.5cc Celestone soluspan injected into the left plantar fascia.  3. Rx for Medrol Dose Pak placed 4. Rx for Meloxicam 15 mg ordered for patient.  Patient was originally ordered meloxicam 7.5 mg. 5. Instructed patient regarding therapies and modalities at home to alleviate symptoms.  6.  Recommend OTC insoles.  Continue [employer] running shoes from [employer] running store 7.  Return to clinic 4 weeks  *RN at home for inpatient DM education   Edrick Kins, DPM [facility]  Dr. Edrick Kins, DPM    [address], [place] [address]                Office [phone]  Fax  [phone]

## 2020-05-26 ENCOUNTER — Ambulatory Visit: Payer: 59 | Admitting: Podiatry

## 2020-05-31 ENCOUNTER — Other Ambulatory Visit: Payer: Self-pay

## 2020-05-31 ENCOUNTER — Ambulatory Visit (INDEPENDENT_AMBULATORY_CARE_PROVIDER_SITE_OTHER): Payer: 59 | Admitting: Podiatry

## 2020-05-31 ENCOUNTER — Other Ambulatory Visit: Payer: Self-pay | Admitting: Podiatry

## 2020-05-31 DIAGNOSIS — M722 Plantar fascial fibromatosis: Secondary | ICD-10-CM

## 2020-05-31 MED ORDER — MELOXICAM 15 MG PO TABS: 15.0000 mg | ORAL_TABLET | Freq: Every day | ORAL | 1 refills | Status: DC

## 2020-05-31 MED FILL — MELOXICAM 15 MG TABLET: 15 | 30 days supply | Qty: 30 | Fill #0

## 2020-05-31 NOTE — Progress Notes (Signed)
   Subjective: 45 y.o. female presenting today for follow-up evaluation of plantar fasciitis to the left foot.  Patient was seen by another physician here in the office and received a steroid injection on 03/17/2020.  Patient states she felt significant relief.  She presents for follow-up treatment evaluation   Past Medical History:  Diagnosis Date  . Blood type O-   . Complication of anesthesia    bp dropped with 1st spinal  . Menorrhagia   . Spontaneous abortion    END OF 2011/2012, 2017  . Umbilical hernia   . Uterine fibroid      Objective: Physical Exam General: The patient is alert and oriented x3 in no acute distress.  Dermatology: Skin is warm, dry and supple bilateral lower extremities. Negative for open lesions or macerations bilateral.   Vascular: Dorsalis Pedis and Posterior Tibial pulses palpable bilateral.  Capillary fill time is immediate to all digits.  Neurological: Epicritic and protective threshold intact bilateral.   Musculoskeletal: Tenderness to palpation to the plantar aspect of the left heel along the plantar fascia. All other joints range of motion within normal limits bilateral. Strength 5/5 in all groups bilateral.    Assessment: 1. Plantar fasciitis left foot  Plan of Care:  1. Patient evaluated.   2.  Patient declined injection today.  She has had 2 injections now and the only helped temporarily for approximately 1-2 weeks 3.  Refill prescription for meloxicam 15 mg 4.  Recommend OTC arch supports at [facility].  We did discuss custom arch supports but I believe the patient would benefit from over-the-counter since her foot structure falls within the normal range 5.  We also did discuss surgical intervention as an option if the patient fails to improve over the next few months. 6.  Return to clinic in 2 months   *RN at home for inpatient DM education   Edrick Kins, DPM [facility]  Dr. Edrick Kins, DPM     [address], [place] [address]                Office [phone]  Fax [phone]

## 2020-06-28 ENCOUNTER — Other Ambulatory Visit: Payer: Self-pay | Admitting: Physician Assistant

## 2020-06-30 ENCOUNTER — Other Ambulatory Visit (HOSPITAL_COMMUNITY): Payer: Self-pay | Admitting: Physician Assistant

## 2020-06-30 DIAGNOSIS — D179 Benign lipomatous neoplasm, unspecified: Secondary | ICD-10-CM | POA: Diagnosis not present

## 2020-06-30 DIAGNOSIS — F419 Anxiety disorder, unspecified: Secondary | ICD-10-CM | POA: Diagnosis not present

## 2020-06-30 MED FILL — ESCITALOPRAM 5 MG TABLET: 5 | 90 days supply | Qty: 90 | Fill #0

## 2020-08-26 DIAGNOSIS — D1779 Benign lipomatous neoplasm of other sites: Secondary | ICD-10-CM | POA: Diagnosis not present

## 2020-09-08 ENCOUNTER — Other Ambulatory Visit: Payer: Self-pay | Admitting: Physician Assistant

## 2020-09-08 DIAGNOSIS — Z1231 Encounter for screening mammogram for malignant neoplasm of breast: Secondary | ICD-10-CM

## 2020-09-23 DIAGNOSIS — R222 Localized swelling, mass and lump, trunk: Secondary | ICD-10-CM | POA: Diagnosis not present

## 2020-09-30 ENCOUNTER — Other Ambulatory Visit: Payer: Self-pay | Admitting: Surgery

## 2020-10-06 ENCOUNTER — Other Ambulatory Visit (HOSPITAL_COMMUNITY): Payer: Self-pay

## 2020-10-06 ENCOUNTER — Other Ambulatory Visit: Payer: Self-pay | Admitting: Obstetrics and Gynecology

## 2020-10-06 ENCOUNTER — Telehealth: Payer: Self-pay

## 2020-10-06 MED ORDER — NITROFURANTOIN MONOHYD MACRO 100 MG PO CAPS
100.0000 mg | ORAL_CAPSULE | Freq: Two times a day (BID) | ORAL | 0 refills | Status: AC
  Filled 2020-10-06: qty 10, 5d supply, fill #0

## 2020-10-06 NOTE — Telephone Encounter (Signed)
Pt aware and very appreciative.

## 2020-10-06 NOTE — Telephone Encounter (Signed)
Pt calling; needs new order for Macrobid sent to [facility]; has a standing order that has run out; feels like she is getting a UTI.  [phone]

## 2020-10-06 NOTE — Telephone Encounter (Signed)
Rx has been sent  

## 2020-10-12 ENCOUNTER — Other Ambulatory Visit: Payer: Self-pay | Admitting: Surgery

## 2020-10-12 DIAGNOSIS — R222 Localized swelling, mass and lump, trunk: Secondary | ICD-10-CM

## 2020-10-27 ENCOUNTER — Other Ambulatory Visit (HOSPITAL_COMMUNITY): Payer: Self-pay

## 2020-10-27 DIAGNOSIS — Z0000 Encounter for general adult medical examination without abnormal findings: Secondary | ICD-10-CM | POA: Diagnosis not present

## 2020-10-27 DIAGNOSIS — F419 Anxiety disorder, unspecified: Secondary | ICD-10-CM | POA: Diagnosis not present

## 2020-10-27 DIAGNOSIS — Z131 Encounter for screening for diabetes mellitus: Secondary | ICD-10-CM | POA: Diagnosis not present

## 2020-10-27 DIAGNOSIS — Z13220 Encounter for screening for lipoid disorders: Secondary | ICD-10-CM | POA: Diagnosis not present

## 2020-10-27 MED ORDER — CLONAZEPAM 0.5 MG PO TABS
0.2500 mg | ORAL_TABLET | Freq: Every day | ORAL | 0 refills | Status: DC | PRN
  Filled 2020-10-27: qty 15, 30d supply, fill #0

## 2020-10-28 ENCOUNTER — Other Ambulatory Visit: Payer: 59

## 2020-11-02 ENCOUNTER — Ambulatory Visit
Admission: RE | Admit: 2020-11-02 | Discharge: 2020-11-02 | Disposition: A | Discharge status: Home / Self Care | Payer: 59 | Source: Ambulatory Visit | Attending: Physician Assistant | Admitting: Physician Assistant

## 2020-11-02 ENCOUNTER — Other Ambulatory Visit: Payer: Self-pay

## 2020-11-02 DIAGNOSIS — Z1231 Encounter for screening mammogram for malignant neoplasm of breast: Secondary | ICD-10-CM

## 2020-11-04 ENCOUNTER — Other Ambulatory Visit: Payer: Self-pay | Admitting: Physician Assistant

## 2020-11-04 DIAGNOSIS — R928 Other abnormal and inconclusive findings on diagnostic imaging of breast: Secondary | ICD-10-CM

## 2020-11-05 DIAGNOSIS — C50912 Malignant neoplasm of unspecified site of left female breast: Secondary | ICD-10-CM | POA: Insufficient documentation

## 2020-11-23 ENCOUNTER — Ambulatory Visit
Admission: RE | Admit: 2020-11-23 | Discharge: 2020-11-23 | Disposition: A | Discharge status: Home / Self Care | Payer: 59 | Source: Ambulatory Visit | Attending: Surgery | Admitting: Surgery

## 2020-11-23 DIAGNOSIS — R221 Localized swelling, mass and lump, neck: Secondary | ICD-10-CM | POA: Diagnosis not present

## 2020-11-23 DIAGNOSIS — R222 Localized swelling, mass and lump, trunk: Secondary | ICD-10-CM

## 2020-11-24 ENCOUNTER — Other Ambulatory Visit (HOSPITAL_COMMUNITY): Payer: Self-pay

## 2020-11-24 ENCOUNTER — Ambulatory Visit
Admission: RE | Admit: 2020-11-24 | Discharge: 2020-11-24 | Disposition: A | Discharge status: Home / Self Care | Payer: 59 | Source: Ambulatory Visit | Attending: Physician Assistant | Admitting: Physician Assistant

## 2020-11-24 ENCOUNTER — Other Ambulatory Visit: Payer: Self-pay | Admitting: Physician Assistant

## 2020-11-24 DIAGNOSIS — N650 Deformity of reconstructed breast: Secondary | ICD-10-CM | POA: Diagnosis not present

## 2020-11-24 DIAGNOSIS — R928 Other abnormal and inconclusive findings on diagnostic imaging of breast: Secondary | ICD-10-CM

## 2020-11-24 DIAGNOSIS — R922 Inconclusive mammogram: Secondary | ICD-10-CM | POA: Diagnosis not present

## 2020-11-24 MED ORDER — BUPROPION HCL ER (XL) 150 MG PO TB24
150.0000 mg | ORAL_TABLET | Freq: Every morning | ORAL | 3 refills | Status: DC
  Filled 2020-11-24: qty 30, 30d supply, fill #0
  Filled 2020-12-15: qty 30, 30d supply, fill #1
  Filled 2021-01-21: qty 30, 30d supply, fill #2
  Filled 2021-02-24: qty 30, 30d supply, fill #3

## 2020-11-25 ENCOUNTER — Ambulatory Visit
Admission: RE | Admit: 2020-11-25 | Discharge: 2020-11-25 | Disposition: A | Discharge status: Home / Self Care | Payer: 59 | Source: Ambulatory Visit | Attending: Physician Assistant | Admitting: Physician Assistant

## 2020-11-25 ENCOUNTER — Other Ambulatory Visit: Payer: Self-pay | Admitting: Obstetrics and Gynecology

## 2020-11-25 ENCOUNTER — Other Ambulatory Visit: Payer: Self-pay

## 2020-11-25 DIAGNOSIS — R928 Other abnormal and inconclusive findings on diagnostic imaging of breast: Secondary | ICD-10-CM

## 2020-11-25 DIAGNOSIS — C50412 Malignant neoplasm of upper-outer quadrant of left female breast: Secondary | ICD-10-CM | POA: Diagnosis not present

## 2020-11-25 DIAGNOSIS — C50212 Malignant neoplasm of upper-inner quadrant of left female breast: Secondary | ICD-10-CM

## 2020-11-25 DIAGNOSIS — Z170 Estrogen receptor positive status [ER+]: Secondary | ICD-10-CM | POA: Diagnosis not present

## 2020-12-03 DIAGNOSIS — C50412 Malignant neoplasm of upper-outer quadrant of left female breast: Secondary | ICD-10-CM | POA: Diagnosis not present

## 2020-12-03 DIAGNOSIS — Z170 Estrogen receptor positive status [ER+]: Secondary | ICD-10-CM | POA: Diagnosis not present

## 2020-12-06 DIAGNOSIS — C50912 Malignant neoplasm of unspecified site of left female breast: Secondary | ICD-10-CM | POA: Diagnosis not present

## 2020-12-08 DIAGNOSIS — C50912 Malignant neoplasm of unspecified site of left female breast: Secondary | ICD-10-CM | POA: Diagnosis not present

## 2020-12-08 DIAGNOSIS — Z170 Estrogen receptor positive status [ER+]: Secondary | ICD-10-CM | POA: Diagnosis not present

## 2020-12-08 DIAGNOSIS — C50412 Malignant neoplasm of upper-outer quadrant of left female breast: Secondary | ICD-10-CM | POA: Diagnosis not present

## 2020-12-15 ENCOUNTER — Other Ambulatory Visit (HOSPITAL_COMMUNITY): Payer: Self-pay

## 2020-12-16 DIAGNOSIS — C50912 Malignant neoplasm of unspecified site of left female breast: Secondary | ICD-10-CM | POA: Diagnosis not present

## 2020-12-16 DIAGNOSIS — C50412 Malignant neoplasm of upper-outer quadrant of left female breast: Secondary | ICD-10-CM | POA: Diagnosis not present

## 2020-12-16 DIAGNOSIS — Z170 Estrogen receptor positive status [ER+]: Secondary | ICD-10-CM | POA: Diagnosis not present

## 2020-12-16 DIAGNOSIS — R590 Localized enlarged lymph nodes: Secondary | ICD-10-CM | POA: Diagnosis not present

## 2020-12-20 ENCOUNTER — Other Ambulatory Visit (HOSPITAL_COMMUNITY): Payer: Self-pay

## 2020-12-22 DIAGNOSIS — R590 Localized enlarged lymph nodes: Secondary | ICD-10-CM | POA: Diagnosis not present

## 2020-12-22 DIAGNOSIS — R9389 Abnormal findings on diagnostic imaging of other specified body structures: Secondary | ICD-10-CM | POA: Diagnosis not present

## 2020-12-22 DIAGNOSIS — R928 Other abnormal and inconclusive findings on diagnostic imaging of breast: Secondary | ICD-10-CM | POA: Diagnosis not present

## 2020-12-22 DIAGNOSIS — R2233 Localized swelling, mass and lump, upper limb, bilateral: Secondary | ICD-10-CM | POA: Diagnosis not present

## 2020-12-24 DIAGNOSIS — Z1379 Encounter for other screening for genetic and chromosomal anomalies: Secondary | ICD-10-CM | POA: Insufficient documentation

## 2020-12-27 DIAGNOSIS — Z803 Family history of malignant neoplasm of breast: Secondary | ICD-10-CM | POA: Diagnosis not present

## 2020-12-27 DIAGNOSIS — Z170 Estrogen receptor positive status [ER+]: Secondary | ICD-10-CM | POA: Diagnosis not present

## 2020-12-27 DIAGNOSIS — N6320 Unspecified lump in the left breast, unspecified quadrant: Secondary | ICD-10-CM | POA: Diagnosis not present

## 2020-12-27 DIAGNOSIS — D0512 Intraductal carcinoma in situ of left breast: Secondary | ICD-10-CM | POA: Diagnosis not present

## 2020-12-27 DIAGNOSIS — Z833 Family history of diabetes mellitus: Secondary | ICD-10-CM | POA: Diagnosis not present

## 2020-12-27 DIAGNOSIS — N6022 Fibroadenosis of left breast: Secondary | ICD-10-CM | POA: Diagnosis not present

## 2020-12-27 DIAGNOSIS — C50412 Malignant neoplasm of upper-outer quadrant of left female breast: Secondary | ICD-10-CM | POA: Diagnosis not present

## 2020-12-27 DIAGNOSIS — N6321 Unspecified lump in the left breast, upper outer quadrant: Secondary | ICD-10-CM | POA: Diagnosis not present

## 2020-12-27 DIAGNOSIS — Z8249 Family history of ischemic heart disease and other diseases of the circulatory system: Secondary | ICD-10-CM | POA: Diagnosis not present

## 2020-12-27 DIAGNOSIS — C50912 Malignant neoplasm of unspecified site of left female breast: Secondary | ICD-10-CM | POA: Diagnosis not present

## 2020-12-27 DIAGNOSIS — Z8261 Family history of arthritis: Secondary | ICD-10-CM | POA: Diagnosis not present

## 2020-12-27 DIAGNOSIS — N6012 Diffuse cystic mastopathy of left breast: Secondary | ICD-10-CM | POA: Diagnosis not present

## 2020-12-27 DIAGNOSIS — Z8349 Family history of other endocrine, nutritional and metabolic diseases: Secondary | ICD-10-CM | POA: Diagnosis not present

## 2021-01-19 ENCOUNTER — Other Ambulatory Visit (HOSPITAL_COMMUNITY): Payer: Self-pay

## 2021-01-19 DIAGNOSIS — Z170 Estrogen receptor positive status [ER+]: Secondary | ICD-10-CM | POA: Diagnosis not present

## 2021-01-19 DIAGNOSIS — C50912 Malignant neoplasm of unspecified site of left female breast: Secondary | ICD-10-CM | POA: Diagnosis not present

## 2021-01-19 DIAGNOSIS — C50412 Malignant neoplasm of upper-outer quadrant of left female breast: Secondary | ICD-10-CM | POA: Diagnosis not present

## 2021-01-19 MED ORDER — GABAPENTIN 100 MG PO CAPS
ORAL_CAPSULE | ORAL | 3 refills | Status: DC
  Filled 2021-01-19: qty 360, 90d supply, fill #0

## 2021-01-21 ENCOUNTER — Other Ambulatory Visit (HOSPITAL_COMMUNITY): Payer: Self-pay

## 2021-01-25 DIAGNOSIS — C50412 Malignant neoplasm of upper-outer quadrant of left female breast: Secondary | ICD-10-CM | POA: Diagnosis not present

## 2021-01-25 DIAGNOSIS — Z170 Estrogen receptor positive status [ER+]: Secondary | ICD-10-CM | POA: Insufficient documentation

## 2021-01-25 DIAGNOSIS — C50912 Malignant neoplasm of unspecified site of left female breast: Secondary | ICD-10-CM | POA: Diagnosis not present

## 2021-02-05 DIAGNOSIS — Z923 Personal history of irradiation: Secondary | ICD-10-CM | POA: Insufficient documentation

## 2021-02-07 DIAGNOSIS — C50912 Malignant neoplasm of unspecified site of left female breast: Secondary | ICD-10-CM | POA: Diagnosis not present

## 2021-02-08 DIAGNOSIS — C50912 Malignant neoplasm of unspecified site of left female breast: Secondary | ICD-10-CM | POA: Diagnosis not present

## 2021-02-09 DIAGNOSIS — C50912 Malignant neoplasm of unspecified site of left female breast: Secondary | ICD-10-CM | POA: Diagnosis not present

## 2021-02-10 DIAGNOSIS — E2839 Other primary ovarian failure: Secondary | ICD-10-CM | POA: Diagnosis not present

## 2021-02-10 DIAGNOSIS — C50412 Malignant neoplasm of upper-outer quadrant of left female breast: Secondary | ICD-10-CM | POA: Diagnosis not present

## 2021-02-10 DIAGNOSIS — Z23 Encounter for immunization: Secondary | ICD-10-CM | POA: Diagnosis not present

## 2021-02-10 DIAGNOSIS — Z170 Estrogen receptor positive status [ER+]: Secondary | ICD-10-CM | POA: Diagnosis not present

## 2021-02-10 DIAGNOSIS — C50912 Malignant neoplasm of unspecified site of left female breast: Secondary | ICD-10-CM | POA: Diagnosis not present

## 2021-02-11 DIAGNOSIS — C50912 Malignant neoplasm of unspecified site of left female breast: Secondary | ICD-10-CM | POA: Diagnosis not present

## 2021-02-14 DIAGNOSIS — C50912 Malignant neoplasm of unspecified site of left female breast: Secondary | ICD-10-CM | POA: Diagnosis not present

## 2021-02-15 DIAGNOSIS — L821 Other seborrheic keratosis: Secondary | ICD-10-CM | POA: Diagnosis not present

## 2021-02-15 DIAGNOSIS — L578 Other skin changes due to chronic exposure to nonionizing radiation: Secondary | ICD-10-CM | POA: Diagnosis not present

## 2021-02-15 DIAGNOSIS — Z23 Encounter for immunization: Secondary | ICD-10-CM | POA: Diagnosis not present

## 2021-02-15 DIAGNOSIS — C50912 Malignant neoplasm of unspecified site of left female breast: Secondary | ICD-10-CM | POA: Diagnosis not present

## 2021-02-15 DIAGNOSIS — L814 Other melanin hyperpigmentation: Secondary | ICD-10-CM | POA: Diagnosis not present

## 2021-02-15 DIAGNOSIS — D225 Melanocytic nevi of trunk: Secondary | ICD-10-CM | POA: Diagnosis not present

## 2021-02-16 DIAGNOSIS — C50912 Malignant neoplasm of unspecified site of left female breast: Secondary | ICD-10-CM | POA: Diagnosis not present

## 2021-02-17 DIAGNOSIS — C50912 Malignant neoplasm of unspecified site of left female breast: Secondary | ICD-10-CM | POA: Diagnosis not present

## 2021-02-18 DIAGNOSIS — M25522 Pain in left elbow: Secondary | ICD-10-CM | POA: Diagnosis not present

## 2021-02-18 DIAGNOSIS — C50912 Malignant neoplasm of unspecified site of left female breast: Secondary | ICD-10-CM | POA: Diagnosis not present

## 2021-02-21 DIAGNOSIS — C50912 Malignant neoplasm of unspecified site of left female breast: Secondary | ICD-10-CM | POA: Diagnosis not present

## 2021-02-22 DIAGNOSIS — C50912 Malignant neoplasm of unspecified site of left female breast: Secondary | ICD-10-CM | POA: Diagnosis not present

## 2021-02-23 DIAGNOSIS — C50912 Malignant neoplasm of unspecified site of left female breast: Secondary | ICD-10-CM | POA: Diagnosis not present

## 2021-02-24 ENCOUNTER — Other Ambulatory Visit (HOSPITAL_COMMUNITY): Payer: Self-pay

## 2021-02-24 DIAGNOSIS — C50912 Malignant neoplasm of unspecified site of left female breast: Secondary | ICD-10-CM | POA: Diagnosis not present

## 2021-02-25 ENCOUNTER — Other Ambulatory Visit (HOSPITAL_COMMUNITY): Payer: Self-pay

## 2021-02-25 DIAGNOSIS — Z510 Encounter for antineoplastic radiation therapy: Secondary | ICD-10-CM | POA: Diagnosis not present

## 2021-02-25 DIAGNOSIS — C50912 Malignant neoplasm of unspecified site of left female breast: Secondary | ICD-10-CM | POA: Diagnosis not present

## 2021-02-25 DIAGNOSIS — Z170 Estrogen receptor positive status [ER+]: Secondary | ICD-10-CM | POA: Diagnosis not present

## 2021-02-25 DIAGNOSIS — C50412 Malignant neoplasm of upper-outer quadrant of left female breast: Secondary | ICD-10-CM | POA: Diagnosis not present

## 2021-02-25 MED ORDER — MOMETASONE FUROATE 0.1 % EX CREA
1.0000 "application " | TOPICAL_CREAM | Freq: Every day | CUTANEOUS | 0 refills | Status: DC
  Filled 2021-02-25: qty 45, 30d supply, fill #0

## 2021-02-28 DIAGNOSIS — C50912 Malignant neoplasm of unspecified site of left female breast: Secondary | ICD-10-CM | POA: Diagnosis not present

## 2021-03-01 DIAGNOSIS — C50912 Malignant neoplasm of unspecified site of left female breast: Secondary | ICD-10-CM | POA: Diagnosis not present

## 2021-03-02 DIAGNOSIS — C50912 Malignant neoplasm of unspecified site of left female breast: Secondary | ICD-10-CM | POA: Diagnosis not present

## 2021-03-03 DIAGNOSIS — C50912 Malignant neoplasm of unspecified site of left female breast: Secondary | ICD-10-CM | POA: Diagnosis not present

## 2021-03-04 ENCOUNTER — Other Ambulatory Visit (HOSPITAL_COMMUNITY): Payer: Self-pay

## 2021-03-04 DIAGNOSIS — C50412 Malignant neoplasm of upper-outer quadrant of left female breast: Secondary | ICD-10-CM | POA: Diagnosis not present

## 2021-03-04 DIAGNOSIS — C50912 Malignant neoplasm of unspecified site of left female breast: Secondary | ICD-10-CM | POA: Diagnosis not present

## 2021-03-04 DIAGNOSIS — Z170 Estrogen receptor positive status [ER+]: Secondary | ICD-10-CM | POA: Diagnosis not present

## 2021-03-04 MED ORDER — BUPROPION HCL ER (XL) 150 MG PO TB24
150.0000 mg | ORAL_TABLET | Freq: Every morning | ORAL | 2 refills | Status: DC
  Filled 2021-03-04: qty 90, 90d supply, fill #0

## 2021-03-04 MED ORDER — SILVER SULFADIAZINE 1 % EX CREA
TOPICAL_CREAM | Freq: Two times a day (BID) | CUTANEOUS | 0 refills | Status: DC
  Filled 2021-03-04: qty 75, 20d supply, fill #0

## 2021-03-07 DIAGNOSIS — C50912 Malignant neoplasm of unspecified site of left female breast: Secondary | ICD-10-CM | POA: Diagnosis not present

## 2021-03-08 ENCOUNTER — Encounter: Payer: Self-pay | Admitting: Orthopaedic Surgery

## 2021-03-08 ENCOUNTER — Ambulatory Visit (INDEPENDENT_AMBULATORY_CARE_PROVIDER_SITE_OTHER): Payer: 59

## 2021-03-08 ENCOUNTER — Other Ambulatory Visit: Payer: Self-pay

## 2021-03-08 ENCOUNTER — Ambulatory Visit (INDEPENDENT_AMBULATORY_CARE_PROVIDER_SITE_OTHER): Payer: 59 | Admitting: Orthopaedic Surgery

## 2021-03-08 DIAGNOSIS — M25522 Pain in left elbow: Secondary | ICD-10-CM | POA: Diagnosis not present

## 2021-03-08 NOTE — Progress Notes (Signed)
Office Visit Note   Patient: RENNE Murphy           Date of Birth: 1976/03/25           MRN: [id number] Visit Date: 03/08/2021              Requested by: Lennie Odor, PA [address] [place],  [place] [address] PCP: Lennie Odor, PA   Assessment & Plan: Visit Diagnoses:  1. Pain in left elbow     Plan: Impression is left elbow lateral epicondylitis.  We had discussion on the condition and treatment options.  We provided her with exercises to do at home.  She will also consider counterforce brace and Voltaren gel.  She will rest as well.  We will see her back as needed.  Follow-Up Instructions: No follow-ups on file.   Orders:  Orders Placed This Encounter  Procedures   XR Elbow Complete Left (3+View)   No orders of the defined types were placed in this encounter.     Procedures: No procedures performed   Clinical Data: No additional findings.   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  She feels like it started after she did an exercise class.  She is felt some improvement over the last few weeks.  She is right-hand dominant.  Takes Motrin for the pain.  Finished radiation yesterday for breast cancer.  Denies any numbness and tingling in the forearm or left hand.   Review of Systems  Constitutional: Negative.   HENT: Negative.    Eyes: Negative.   Respiratory: Negative.    Cardiovascular: Negative.   Endocrine: Negative.   Musculoskeletal: Negative.   Neurological: Negative.   Hematological: Negative.   Psychiatric/Behavioral: Negative.    All other systems reviewed and are negative.   Objective: Vital Signs: LMP 11/17/2018 (Exact Date)   Physical Exam Vitals and nursing note reviewed.  Constitutional:      Appearance: She is  well-developed.  Pulmonary:     Effort: Pulmonary effort is normal.  Skin:    General: Skin is warm.     Capillary Refill: Capillary refill takes less than 2 seconds.  Neurological:     Mental Status: She is alert and oriented to person, place, and time.  Psychiatric:        Behavior: Behavior normal.        Thought Content: Thought content normal.        Judgment: Judgment normal.    Ortho Exam  Left elbow shows full range of motion in pronation supination.  She is tender to the common extensor tendon.  Radial tunnel is nontender.  Negative Tinel radial tunnel.  Pain with resisted long finger extension and wrist extension.  Specialty Comments:  No specialty comments available.  Imaging: XR Elbow Complete Left (3+View)  Result Date: 03/08/2021 Small calcifications lateral to the lateral epicondyle consistent with chronic tendinosis.    PMFS History: Patient Active Problem List   Diagnosis Date Noted   Anxiety 06/19/2019   Intraoperative injury of bladder 11/30/2018   Intraoperative bladder injury 11/30/2018   S/P hysterectomy 11/28/2018   Family history of colon cancer 12/04/2017   Uterine fibroid    Umbilical hernia    Menorrhagia    Blood type O-  Past Medical History:  Diagnosis Date   Blood type O-    Complication of anesthesia    bp dropped with 1st spinal   Menorrhagia    Spontaneous abortion    END OF 4008/6761, 9509   Umbilical hernia    Uterine fibroid     Family History  Problem Relation Age of Onset   Congestive Heart Failure Mother    Hypertension Mother    Thyroid disease Mother        HYPO   Lupus Mother        ERYTHEMATOSUS   Rheum arthritis Mother    Sjogren's syndrome Mother    Colitis Mother        collagenous   Alcohol abuse Father        DECEASED Jul 24, 2009   Cancer Maternal Grandmother 36       COLON   Heart disease Maternal Grandmother    Thyroid disease Maternal Grandmother    Heart disease Maternal Grandfather    Diabetes  Paternal Grandfather        TYPE 2   Heart disease Paternal Grandfather    Cancer Cousin 42       BREAST   Alcohol abuse Cousin 23   Diabetes Son        TYPE 1   Thyroid disease Maternal Aunt    Breast cancer Cousin     Past Surgical History:  Procedure Laterality Date   CESAREAN SECTION     X3   HYSTEROSCOPY  03/25/2009   HYSTEROSCOPIC IUD REMOVAL   LAPAROSCOPIC HYSTERECTOMY N/A 11/28/2018   Procedure: HYSTERECTOMY TOTAL LAPAROSCOPIC;  Surgeon: Malachy Mood, MD;  Location: [facility] ORS;  Service: Gynecology;  Laterality: N/A;   LAPAROSCOPY N/A 11/30/2018   Procedure: LAPAROSCOPY OPERATIVE,cystoscopy,bladder repair,retrograde pyelogram;  Surgeon: Gae Dry, MD;  Location: [facility] OR[facility];  Service: Gynecology;  Laterality: N/A;   UMBILICAL HERNIA REPAIR N/A 11/28/2018   Procedure: HERNIA REPAIR UMBILICAL ADULT;  Surgeon: Jules Husbands, MD;  Location: [facility] OR[facility];  Service: General;  Laterality: N/A;   WISDOM TOOTH EXTRACTION     Social History   Occupational History   Occupation: NURSE    Comment: [employer]  Tobacco Use   Smoking status: Former    Types: Cigarettes    Quit date: 11/18/2000    Years since quitting: 20.3   Smokeless tobacco: Never   Tobacco comments:    more socailly  Vaping Use   Vaping Use: Never used  Substance and Sexual Activity   Alcohol use: Yes    Comment: occ   Drug use: No   Sexual activity: Yes    Partners: Male    Birth control/protection: None

## 2021-03-10 ENCOUNTER — Other Ambulatory Visit (HOSPITAL_COMMUNITY): Payer: Self-pay

## 2021-03-10 MED ORDER — LETROZOLE 2.5 MG PO TABS
2.5000 mg | ORAL_TABLET | Freq: Every day | ORAL | 11 refills | Status: DC
  Filled 2021-03-10: qty 30, 30d supply, fill #0
  Filled 2021-04-19: qty 30, 30d supply, fill #1
  Filled 2021-05-29: qty 30, 30d supply, fill #2
  Filled 2021-06-27: qty 90, 90d supply, fill #3
  Filled 2021-10-09: qty 90, 90d supply, fill #4
  Filled 2022-01-14: qty 90, 90d supply, fill #5

## 2021-03-11 ENCOUNTER — Other Ambulatory Visit (HOSPITAL_COMMUNITY): Payer: Self-pay

## 2021-03-11 DIAGNOSIS — H5213 Myopia, bilateral: Secondary | ICD-10-CM | POA: Diagnosis not present

## 2021-03-11 MED ORDER — CLONAZEPAM 0.5 MG PO TABS
0.2500 mg | ORAL_TABLET | Freq: Every day | ORAL | 0 refills | Status: AC
  Filled 2021-03-11: qty 15, 30d supply, fill #0

## 2021-03-15 ENCOUNTER — Other Ambulatory Visit (HOSPITAL_COMMUNITY): Payer: Self-pay

## 2021-03-15 DIAGNOSIS — C50919 Malignant neoplasm of unspecified site of unspecified female breast: Secondary | ICD-10-CM | POA: Diagnosis not present

## 2021-03-15 DIAGNOSIS — F419 Anxiety disorder, unspecified: Secondary | ICD-10-CM | POA: Diagnosis not present

## 2021-03-15 DIAGNOSIS — F410 Panic disorder [episodic paroxysmal anxiety] without agoraphobia: Secondary | ICD-10-CM | POA: Diagnosis not present

## 2021-03-15 MED ORDER — ESCITALOPRAM OXALATE 10 MG PO TABS
10.0000 mg | ORAL_TABLET | Freq: Every day | ORAL | 3 refills | Status: DC
  Filled 2021-03-15: qty 90, 90d supply, fill #0
  Filled 2021-06-27: qty 90, 90d supply, fill #1

## 2021-03-21 ENCOUNTER — Other Ambulatory Visit: Payer: Self-pay

## 2021-03-21 ENCOUNTER — Emergency Department (HOSPITAL_BASED_OUTPATIENT_CLINIC_OR_DEPARTMENT_OTHER)
Admission: EM | Admit: 2021-03-21 | Discharge: 2021-03-21 | Disposition: A | Discharge status: Home / Self Care | Payer: 59 | Attending: Emergency Medicine | Admitting: Emergency Medicine

## 2021-03-21 ENCOUNTER — Other Ambulatory Visit (HOSPITAL_COMMUNITY): Payer: Self-pay

## 2021-03-21 ENCOUNTER — Encounter (HOSPITAL_BASED_OUTPATIENT_CLINIC_OR_DEPARTMENT_OTHER): Payer: Self-pay | Admitting: Emergency Medicine

## 2021-03-21 ENCOUNTER — Emergency Department (HOSPITAL_BASED_OUTPATIENT_CLINIC_OR_DEPARTMENT_OTHER): Payer: 59 | Admitting: Radiology

## 2021-03-21 DIAGNOSIS — N3090 Cystitis, unspecified without hematuria: Secondary | ICD-10-CM | POA: Diagnosis not present

## 2021-03-21 DIAGNOSIS — R42 Dizziness and giddiness: Secondary | ICD-10-CM | POA: Insufficient documentation

## 2021-03-21 DIAGNOSIS — R61 Generalized hyperhidrosis: Secondary | ICD-10-CM | POA: Diagnosis not present

## 2021-03-21 DIAGNOSIS — R002 Palpitations: Secondary | ICD-10-CM | POA: Insufficient documentation

## 2021-03-21 DIAGNOSIS — C50919 Malignant neoplasm of unspecified site of unspecified female breast: Secondary | ICD-10-CM | POA: Diagnosis not present

## 2021-03-21 DIAGNOSIS — R112 Nausea with vomiting, unspecified: Secondary | ICD-10-CM | POA: Diagnosis not present

## 2021-03-21 DIAGNOSIS — Z79899 Other long term (current) drug therapy: Secondary | ICD-10-CM | POA: Insufficient documentation

## 2021-03-21 DIAGNOSIS — Z859 Personal history of malignant neoplasm, unspecified: Secondary | ICD-10-CM | POA: Diagnosis not present

## 2021-03-21 DIAGNOSIS — Z87891 Personal history of nicotine dependence: Secondary | ICD-10-CM | POA: Insufficient documentation

## 2021-03-21 HISTORY — DX: Malignant (primary) neoplasm, unspecified: C80.1

## 2021-03-21 LAB — BASIC METABOLIC PANEL
Anion gap: 11 (ref 5–15)
BUN: 14 mg/dL (ref 6–20)
CO2: 28 mmol/L (ref 22–32)
Calcium: 10.1 mg/dL (ref 8.9–10.3)
Chloride: 97 mmol/L — ABNORMAL LOW (ref 98–111)
Creatinine, Ser: 0.74 mg/dL (ref 0.44–1.00)
GFR, Estimated: >60 mL/min (ref >60)
Glucose, Bld: 80 mg/dL (ref 70–99)
Potassium: 3.7 mmol/L (ref 3.5–5.1)
Sodium: 136 mmol/L (ref 135–145)

## 2021-03-21 LAB — URINALYSIS, ROUTINE W REFLEX MICROSCOPIC
Bilirubin Urine: NEGATIVE
Glucose, UA: NEGATIVE mg/dL
Hgb urine dipstick: NEGATIVE
Ketones, ur: 15 mg/dL — AB
Nitrite: POSITIVE — AB
Specific Gravity, Urine: 1.019 (ref 1.005–1.030)
WBC, UA: >50 WBC/hpf — ABNORMAL HIGH (ref 0–5)
pH: 5.5 (ref 5.0–8.0)

## 2021-03-21 LAB — CBC
HCT: 44.1 % (ref 36.0–46.0)
Hemoglobin: 14.9 g/dL (ref 12.0–15.0)
MCH: 32.7 pg (ref 26.0–34.0)
MCHC: 33.8 g/dL (ref 30.0–36.0)
MCV: 96.9 fL (ref 80.0–100.0)
Platelets: 210 10*3/uL (ref 150–400)
RBC: 4.55 MIL/uL (ref 3.87–5.11)
RDW: 12.2 % (ref 11.5–15.5)
WBC: 5.4 10*3/uL (ref 4.0–10.5)
nRBC: 0 % (ref 0.0–0.2)

## 2021-03-21 LAB — TROPONIN I (HIGH SENSITIVITY): Troponin I (High Sensitivity): <2 ng/L (ref <18)

## 2021-03-21 LAB — MAGNESIUM: Magnesium: 2.2 mg/dL (ref 1.7–2.4)

## 2021-03-21 LAB — TSH: TSH: 1.974 u[IU]/mL (ref 0.350–4.500)

## 2021-03-21 MED ORDER — NITROFURANTOIN MONOHYD MACRO 100 MG PO CAPS
100.0000 mg | ORAL_CAPSULE | Freq: Once | ORAL | Status: AC
  Administered 2021-03-21: 100 mg via ORAL
  Filled 2021-03-21: qty 1

## 2021-03-21 MED ORDER — NITROFURANTOIN MONOHYD MACRO 100 MG PO CAPS
100.0000 mg | ORAL_CAPSULE | Freq: Two times a day (BID) | ORAL | 0 refills | Status: DC
  Filled 2021-03-21: qty 9, 5d supply, fill #0

## 2021-03-21 NOTE — Discharge Instructions (Signed)
Your work-up has been reassuring in the emergency department today.  Your electrolytes are normal.  Your urine does show signs of infection.  We will treat you with antibiotics.  Make sure that you follow-up with your oncologist and primary care doctor.  Developing worsening symptoms return to the ER

## 2021-03-21 NOTE — ED Provider Notes (Signed)
[facility] EMERGENCY DEPT Provider Note   CSN: [id number] Arrival date & time: 03/21/21  1024     History Chief Complaint  Patient presents with   Dizziness    Jessica Murphy is a 45 y.o. female.  HPI 45 year old female presents to the emergency department today for evaluation of palpitations, lightheadedness, diaphoresis, nausea and vomiting.  Patient states that 1 week ago she was getting her nails done when she had episode of lightheadedness, dizziness, palpitations, diaphoresis.  Patient reports that this was the first time ever having this episode.  She reports that the day prior that she completed her last dose of radiation for newly diagnosed breast cancer.  She reports that several weeks prior to that she had her first injection for the breast cancer.  Patient states that the symptoms will come and go over the past week.  She reports that she spoke with her oncologist who has set her up for a Holter monitor this Friday.  Patient denies any frank chest pain or shortness of breath.  No lower leg swelling.  Patient states that she is under a lot of stress now and was recently started on Lexapro.  She is also been taking her clonazepam for symptoms as well.  She reports when she has episode of feels like her blood pressure is low and her heart rate is high.  She also ports that her resting heart rate has been in the 50s as well which is unusual for patient.  Patient denies any abdominal pain.  She reports having some urinary urgency or frequency a few days but this has resolved.  She is prone to UTIs.  Patient denies any hemoptysis.  No cough, fevers or chills    Past Medical History:  Diagnosis Date   Blood type O-    Cancer ([place])    Complication of anesthesia    bp dropped with 1st spinal   Menorrhagia    Spontaneous abortion    END OF 5027/7412, 8786   Umbilical hernia    Uterine fibroid     Patient Active Problem List   Diagnosis Date Noted   Anxiety  06/19/2019   Intraoperative injury of bladder 11/30/2018   Intraoperative bladder injury 11/30/2018   S/P hysterectomy 11/28/2018   Family history of colon cancer 12/04/2017   Uterine fibroid    Umbilical hernia    Menorrhagia    Blood type O-     Past Surgical History:  Procedure Laterality Date   CESAREAN SECTION     X3   HYSTEROSCOPY  03/25/2009   HYSTEROSCOPIC IUD REMOVAL   LAPAROSCOPIC HYSTERECTOMY N/A 11/28/2018   Procedure: HYSTERECTOMY TOTAL LAPAROSCOPIC;  Surgeon: Malachy Mood, MD;  Location: [facility] ORS;  Service: Gynecology;  Laterality: N/A;   LAPAROSCOPY N/A 11/30/2018   Procedure: LAPAROSCOPY OPERATIVE,cystoscopy,bladder repair,retrograde pyelogram;  Surgeon: Gae Dry, MD;  Location: [facility] ORS;  Service: Gynecology;  Laterality: N/A;   UMBILICAL HERNIA REPAIR N/A 11/28/2018   Procedure: HERNIA REPAIR UMBILICAL ADULT;  Surgeon: Jules Husbands, MD;  Location: [facility] ORS;  Service: General;  Laterality: N/A;   WISDOM TOOTH EXTRACTION       OB History     Gravida  5   Para  3   Term  3   Preterm      AB  2   Living  3      SAB  2   IAB      Ectopic      Multiple  Live Births  3           Family History  Problem Relation Age of Onset   Congestive Heart Failure Mother    Hypertension Mother    Thyroid disease Mother        HYPO   Lupus Mother        ERYTHEMATOSUS   Rheum arthritis Mother    Sjogren's syndrome Mother    Colitis Mother        collagenous   Alcohol abuse Father        DECEASED 08-14-2009   Cancer Maternal Grandmother 53       COLON   Heart disease Maternal Grandmother    Thyroid disease Maternal Grandmother    Heart disease Maternal Grandfather    Diabetes Paternal Grandfather        TYPE 2   Heart disease Paternal Grandfather    Cancer Cousin 38       BREAST   Alcohol abuse Cousin 69   Diabetes Son        TYPE 1   Thyroid disease Maternal Aunt    Breast cancer Cousin     Social History   Tobacco Use    Smoking status: Former    Types: Cigarettes    Quit date: 11/18/2000    Years since quitting: 20.3   Smokeless tobacco: Never   Tobacco comments:    more socailly  Vaping Use   Vaping Use: Never used  Substance Use Topics   Alcohol use: Yes    Comment: occ   Drug use: No    Home Medications Prior to Admission medications   Medication Sig Start Date End Date Taking? Authorizing Provider  buPROPion (WELLBUTRIN XL) 150 MG 24 hr tablet Take 1 tablet (150 mg total) by mouth every morning. 03/04/21     Cholecalciferol (VITAMIN D) 50 MCG (2000 UT) tablet Take 2,000 Units by mouth 3 (three) times a week.    [provider]  clonazePAM (KLONOPIN) 0.5 MG tablet Take 0.5 tablets (0.25 mg total) by mouth daily as needed. 10/27/20     clonazePAM (KLONOPIN) 0.5 MG tablet Take 0.5 tablets (0.25 mg total) by mouth daily as needed 03/11/21     escitalopram (LEXAPRO) 10 MG tablet Take 1 tablet (10 mg total) by mouth daily. 03/15/21     letrozole (FEMARA) 2.5 MG tablet Take 1 tablet (2.5 mg total) by mouth daily. 03/10/21     mometasone (ELOCON) 0.1 % cream Apply 1 application topically daily. 02/25/21     PFIZER-BIONTECH COVID-19 VACC 30 MCG/0.3ML injection  12/24/19   [provider]  silver sulfADIAZINE (SILVADENE) 1 % cream Apply topically 2 (two) times daily for any areas of skin breakdown after radiation is complete 03/04/21     vitamin B-12 (CYANOCOBALAMIN) 1000 MCG tablet Take 1,000 mcg by mouth daily.    [provider]    Allergies    Guaifenesin, Bactrim [sulfamethoxazole-trimethoprim], and Erythromycin  Review of Systems   Review of Systems  Constitutional:  Positive for diaphoresis. Negative for chills and fever.  HENT:  Negative for congestion.   Eyes:  Negative for discharge.  Respiratory:  Negative for cough and shortness of breath.   Cardiovascular:  Positive for palpitations. Negative for chest pain and leg swelling.  Gastrointestinal:  Positive for nausea  and vomiting. Negative for abdominal pain and diarrhea.  Genitourinary:  Positive for frequency and urgency.  Musculoskeletal:  Negative for arthralgias.  Skin:  Negative for color change.  Neurological:  Positive for dizziness and light-headedness. Negative for syncope and weakness.  Psychiatric/Behavioral:  Negative for confusion. The patient is nervous/anxious.    Physical Exam Updated Vital Signs BP 133/90 (BP Location: Right Arm)   Pulse 80   Temp 98.9 F (37.2 C)   Resp 16   Ht 5\' 4"  (1.626 m)   Wt 60.8 kg   LMP 11/17/2018 (Exact Date)   SpO2 98%   BMI 23.00 kg/m   Physical Exam Vitals and nursing note reviewed.  Constitutional:      General: She is not in acute distress.    Appearance: She is well-developed. She is not ill-appearing or toxic-appearing.  HENT:     Head: Normocephalic and atraumatic.  Eyes:     General: No scleral icterus.       Right eye: No discharge.        Left eye: No discharge.  Cardiovascular:     Rate and Rhythm: Normal rate and regular rhythm.     Pulses: Normal pulses.     Heart sounds: Normal heart sounds.  Pulmonary:     Effort: Pulmonary effort is normal. No respiratory distress.     Breath sounds: Normal breath sounds. No stridor. No wheezing, rhonchi or rales.  Chest:     Chest wall: No tenderness.  Abdominal:     General: Abdomen is flat. Bowel sounds are normal.     Palpations: Abdomen is soft.  Musculoskeletal:        General: No tenderness. Normal range of motion.     Cervical back: Normal range of motion.     Right lower leg: No edema.     Left lower leg: No edema.  Skin:    General: Skin is warm and dry.     Capillary Refill: Capillary refill takes less than 2 seconds.     Coloration: Skin is not pale.  Neurological:     Mental Status: She is alert.  Psychiatric:        Behavior: Behavior normal.        Thought Content: Thought content normal.        Judgment: Judgment normal.    ED Results / Procedures /  Treatments   Labs (all labs ordered are listed, but only abnormal results are displayed) Labs Reviewed  BASIC METABOLIC PANEL - Abnormal; Notable for the following components:      Result Value   Chloride 97 (*)    All other components within normal limits  URINALYSIS, ROUTINE W REFLEX MICROSCOPIC - Abnormal; Notable for the following components:   APPearance HAZY (*)    Ketones, ur 15 (*)    Protein, ur TRACE (*)    Nitrite POSITIVE (*)    Leukocytes,Ua LARGE (*)    WBC, UA >50 (*)    Bacteria, UA MANY (*)    All other components within normal limits  CBC  MAGNESIUM  TSH  TROPONIN I (HIGH SENSITIVITY)    EKG None  Radiology DG Chest 2 View  Result Date: 03/21/2021 CLINICAL DATA:  Palpitations.  Recent treatment for breast cancer. EXAM: CHEST - 2 VIEW COMPARISON:  None. FINDINGS: Heart size is normal. Lungs are clear. Surgical clips overlie the LEFT anterior chest wall/LOWER axilla. Visualized osseous structures have a normal appearance. IMPRESSION: For Electronically Signed   By: Nolon Nations M.D.   On: 03/21/2021 12:39    Procedures Procedures   Medications Ordered in ED Medications  nitrofurantoin (macrocrystal-monohydrate) (MACROBID) capsule 100 mg (has no administration  in time range)    ED Course  I have reviewed the triage vital signs and the nursing notes.  Pertinent labs & imaging results that were available during my care of the patient were reviewed by me and considered in my medical decision making (see chart for details).    MDM Rules/Calculators/A&P                           44 year old female presents the ER for evaluation of about a week of intermittent symptoms including dizziness, lightheadedness, palpitations, nausea, vomiting.  Patient past medical history documented above.  Patient denies chest pain or shortness of breath.  EKG performed today shows normal sinus rhythm with a heart rate of 77 bpm with a normal PR QRS and QT interval without  any acute signs of ST elevation or depression there is no worrisome arrhythmias.  Patient has no tachycardia hypoxia in the ER.  Low sufficient for PE.  Patient has no leukocytosis.  Hemoglobin at baseline.  No significant electrolyte derangement.  Troponin is normal as well.  TSH is pending.  UA does show concern for infection.  Patient denies any symptoms consistent with pyelonephritis and there is no signs of systemic illness.  Chest x-ray performed and reviewed shows no acute cardiac or pulmonary abnormality.  Patient symptoms be secondary to recent injection and radiation secondary to breast cancer.  Also consider anxiety as she was just darted back on Lexapro and clonazepam.  Patient does have a urinary tract infection which could also play into her not feeling well as well.  Patient started on Macrobid.  She does have follow-up on Friday with her oncologist for a Holter monitor to be placed which I feel is reasonable.  Patient has had no arrhythmias in the ER. Her vs have remained stable in the ed.  Pt is hemodynamically stable, in NAD, & able to ambulate in the ED. Evaluation does not show pathology that would require ongoing emergent intervention or inpatient treatment. I explained the diagnosis to the patient. Pt has no complaints prior to dc. Pt is comfortable with above plan and is stable for discharge at this time. All questions were answered prior to disposition. Strict return precautions for f/u to the ED were discussed. Encouraged follow up with PCP.  Final Clinical Impression(s) / ED Diagnoses Final diagnoses:  Palpitations  Cystitis    Rx / DC Orders ED Discharge Orders          Ordered    nitrofurantoin, macrocrystal-monohydrate, (MACROBID) 100 MG capsule  2 times daily        03/21/21 1337             Doristine Devoid, PA-C 03/21/21 1344    Deno Etienne, DO 03/21/21 1524

## 2021-03-21 NOTE — ED Notes (Signed)
Patient verbalizes understanding of discharge instructions. Opportunity for questioning and answers were provided. Patient discharged from ED.  °

## 2021-03-21 NOTE — ED Triage Notes (Signed)
Pt reports an intermittent episode of nausea, dizziness, tachycardia and diaphoresis x 1 week. Recently completed treatment for breast cancer.

## 2021-03-25 DIAGNOSIS — Z9889 Other specified postprocedural states: Secondary | ICD-10-CM | POA: Diagnosis not present

## 2021-03-25 DIAGNOSIS — E2839 Other primary ovarian failure: Secondary | ICD-10-CM | POA: Diagnosis not present

## 2021-03-25 DIAGNOSIS — C50412 Malignant neoplasm of upper-outer quadrant of left female breast: Secondary | ICD-10-CM | POA: Diagnosis not present

## 2021-03-25 DIAGNOSIS — Z170 Estrogen receptor positive status [ER+]: Secondary | ICD-10-CM | POA: Diagnosis not present

## 2021-03-30 DIAGNOSIS — R002 Palpitations: Secondary | ICD-10-CM | POA: Diagnosis not present

## 2021-03-30 DIAGNOSIS — Z170 Estrogen receptor positive status [ER+]: Secondary | ICD-10-CM | POA: Diagnosis not present

## 2021-03-30 DIAGNOSIS — C50412 Malignant neoplasm of upper-outer quadrant of left female breast: Secondary | ICD-10-CM | POA: Diagnosis not present

## 2021-04-08 DIAGNOSIS — Z170 Estrogen receptor positive status [ER+]: Secondary | ICD-10-CM | POA: Diagnosis not present

## 2021-04-08 DIAGNOSIS — C50412 Malignant neoplasm of upper-outer quadrant of left female breast: Secondary | ICD-10-CM | POA: Diagnosis not present

## 2021-04-19 ENCOUNTER — Other Ambulatory Visit (HOSPITAL_COMMUNITY): Payer: Self-pay

## 2021-04-28 DIAGNOSIS — Z170 Estrogen receptor positive status [ER+]: Secondary | ICD-10-CM | POA: Diagnosis not present

## 2021-04-28 DIAGNOSIS — C50412 Malignant neoplasm of upper-outer quadrant of left female breast: Secondary | ICD-10-CM | POA: Diagnosis not present

## 2021-04-28 DIAGNOSIS — E2839 Other primary ovarian failure: Secondary | ICD-10-CM | POA: Diagnosis not present

## 2021-05-16 ENCOUNTER — Other Ambulatory Visit (HOSPITAL_COMMUNITY): Payer: Self-pay

## 2021-05-16 MED ORDER — CARESTART COVID-19 HOME TEST VI KIT
PACK | 0 refills | Status: DC
  Filled 2021-05-16: qty 4, 4d supply, fill #0

## 2021-05-23 ENCOUNTER — Other Ambulatory Visit (HOSPITAL_COMMUNITY): Payer: Self-pay

## 2021-05-26 DIAGNOSIS — C50412 Malignant neoplasm of upper-outer quadrant of left female breast: Secondary | ICD-10-CM | POA: Diagnosis not present

## 2021-05-26 DIAGNOSIS — Z170 Estrogen receptor positive status [ER+]: Secondary | ICD-10-CM | POA: Diagnosis not present

## 2021-05-30 ENCOUNTER — Other Ambulatory Visit (HOSPITAL_COMMUNITY): Payer: Self-pay

## 2021-06-27 ENCOUNTER — Other Ambulatory Visit (HOSPITAL_COMMUNITY): Payer: Self-pay

## 2021-06-27 DIAGNOSIS — E2839 Other primary ovarian failure: Secondary | ICD-10-CM | POA: Diagnosis not present

## 2021-06-27 DIAGNOSIS — Z170 Estrogen receptor positive status [ER+]: Secondary | ICD-10-CM | POA: Diagnosis not present

## 2021-06-27 DIAGNOSIS — C50412 Malignant neoplasm of upper-outer quadrant of left female breast: Secondary | ICD-10-CM | POA: Diagnosis not present

## 2021-06-27 DIAGNOSIS — F419 Anxiety disorder, unspecified: Secondary | ICD-10-CM | POA: Diagnosis not present

## 2021-06-27 DIAGNOSIS — F418 Other specified anxiety disorders: Secondary | ICD-10-CM | POA: Diagnosis not present

## 2021-06-27 DIAGNOSIS — R232 Flushing: Secondary | ICD-10-CM | POA: Diagnosis not present

## 2021-06-27 DIAGNOSIS — Z79899 Other long term (current) drug therapy: Secondary | ICD-10-CM | POA: Diagnosis not present

## 2021-06-27 MED ORDER — ESCITALOPRAM OXALATE 10 MG PO TABS
20.0000 mg | ORAL_TABLET | Freq: Every day | ORAL | 3 refills | Status: DC
Start: 2021-06-27 — End: 2023-08-20
  Filled 2021-06-27 (×2): qty 180, 90d supply, fill #0
  Filled 2021-12-06: qty 180, 90d supply, fill #1

## 2021-06-27 MED ORDER — OXYBUTYNIN CHLORIDE 5 MG PO TABS
2.5000 mg | ORAL_TABLET | Freq: Two times a day (BID) | ORAL | 6 refills | Status: DC
  Filled 2021-06-27: qty 30, 30d supply, fill #0
  Filled 2021-08-01: qty 30, 30d supply, fill #1
  Filled 2021-09-13: qty 90, 90d supply, fill #2
  Filled 2021-12-06: qty 90, 90d supply, fill #3

## 2021-06-27 MED ORDER — LETROZOLE 2.5 MG PO TABS
2.5000 mg | ORAL_TABLET | Freq: Every day | ORAL | 3 refills | Status: DC
  Filled 2021-06-27: qty 90, 90d supply, fill #0

## 2021-07-28 DIAGNOSIS — R030 Elevated blood-pressure reading, without diagnosis of hypertension: Secondary | ICD-10-CM | POA: Diagnosis not present

## 2021-08-01 ENCOUNTER — Other Ambulatory Visit (HOSPITAL_COMMUNITY): Payer: Self-pay

## 2021-08-02 DIAGNOSIS — E2839 Other primary ovarian failure: Secondary | ICD-10-CM | POA: Diagnosis not present

## 2021-08-02 DIAGNOSIS — Z170 Estrogen receptor positive status [ER+]: Secondary | ICD-10-CM | POA: Diagnosis not present

## 2021-08-02 DIAGNOSIS — C50412 Malignant neoplasm of upper-outer quadrant of left female breast: Secondary | ICD-10-CM | POA: Diagnosis not present

## 2021-08-30 DIAGNOSIS — Z170 Estrogen receptor positive status [ER+]: Secondary | ICD-10-CM | POA: Diagnosis not present

## 2021-08-30 DIAGNOSIS — C50412 Malignant neoplasm of upper-outer quadrant of left female breast: Secondary | ICD-10-CM | POA: Diagnosis not present

## 2021-08-30 DIAGNOSIS — E2839 Other primary ovarian failure: Secondary | ICD-10-CM | POA: Diagnosis not present

## 2021-09-13 ENCOUNTER — Other Ambulatory Visit (HOSPITAL_COMMUNITY): Payer: Self-pay

## 2021-09-20 DIAGNOSIS — R030 Elevated blood-pressure reading, without diagnosis of hypertension: Secondary | ICD-10-CM | POA: Diagnosis not present

## 2021-09-20 DIAGNOSIS — R232 Flushing: Secondary | ICD-10-CM | POA: Diagnosis not present

## 2021-09-20 DIAGNOSIS — F418 Other specified anxiety disorders: Secondary | ICD-10-CM | POA: Diagnosis not present

## 2021-09-20 DIAGNOSIS — Z79899 Other long term (current) drug therapy: Secondary | ICD-10-CM | POA: Diagnosis not present

## 2021-09-20 DIAGNOSIS — E2839 Other primary ovarian failure: Secondary | ICD-10-CM | POA: Diagnosis not present

## 2021-09-20 DIAGNOSIS — T451X5A Adverse effect of antineoplastic and immunosuppressive drugs, initial encounter: Secondary | ICD-10-CM | POA: Diagnosis not present

## 2021-09-20 DIAGNOSIS — Z923 Personal history of irradiation: Secondary | ICD-10-CM | POA: Diagnosis not present

## 2021-09-20 DIAGNOSIS — Z170 Estrogen receptor positive status [ER+]: Secondary | ICD-10-CM | POA: Diagnosis not present

## 2021-09-20 DIAGNOSIS — C50412 Malignant neoplasm of upper-outer quadrant of left female breast: Secondary | ICD-10-CM | POA: Diagnosis not present

## 2021-09-20 DIAGNOSIS — F419 Anxiety disorder, unspecified: Secondary | ICD-10-CM | POA: Diagnosis not present

## 2021-10-10 ENCOUNTER — Other Ambulatory Visit (HOSPITAL_COMMUNITY): Payer: Self-pay

## 2021-10-17 ENCOUNTER — Telehealth: Payer: 59 | Admitting: Physician Assistant

## 2021-10-17 DIAGNOSIS — R3989 Other symptoms and signs involving the genitourinary system: Secondary | ICD-10-CM | POA: Diagnosis not present

## 2021-10-18 ENCOUNTER — Other Ambulatory Visit (HOSPITAL_COMMUNITY): Payer: Self-pay

## 2021-10-18 DIAGNOSIS — Z170 Estrogen receptor positive status [ER+]: Secondary | ICD-10-CM | POA: Diagnosis not present

## 2021-10-18 DIAGNOSIS — C50412 Malignant neoplasm of upper-outer quadrant of left female breast: Secondary | ICD-10-CM | POA: Diagnosis not present

## 2021-10-18 DIAGNOSIS — E2839 Other primary ovarian failure: Secondary | ICD-10-CM | POA: Diagnosis not present

## 2021-10-18 MED ORDER — NITROFURANTOIN MONOHYD MACRO 100 MG PO CAPS
100.0000 mg | ORAL_CAPSULE | Freq: Two times a day (BID) | ORAL | 0 refills | Status: DC
  Filled 2021-10-18: qty 10, 5d supply, fill #0

## 2021-10-18 NOTE — Progress Notes (Signed)
I have spent 5 minutes in review of e-visit questionnaire, review and updating patient chart, medical decision making and response to patient.   Liora Myles Cody Naksh Radi, PA-C    

## 2021-10-18 NOTE — Progress Notes (Signed)

## 2021-10-25 ENCOUNTER — Ambulatory Visit: Payer: 59 | Admitting: Orthopaedic Surgery

## 2021-10-26 DIAGNOSIS — Z170 Estrogen receptor positive status [ER+]: Secondary | ICD-10-CM | POA: Diagnosis not present

## 2021-10-26 DIAGNOSIS — C50412 Malignant neoplasm of upper-outer quadrant of left female breast: Secondary | ICD-10-CM | POA: Diagnosis not present

## 2021-10-31 DIAGNOSIS — F419 Anxiety disorder, unspecified: Secondary | ICD-10-CM | POA: Diagnosis not present

## 2021-10-31 DIAGNOSIS — Z1211 Encounter for screening for malignant neoplasm of colon: Secondary | ICD-10-CM | POA: Diagnosis not present

## 2021-10-31 DIAGNOSIS — Z0000 Encounter for general adult medical examination without abnormal findings: Secondary | ICD-10-CM | POA: Diagnosis not present

## 2021-10-31 DIAGNOSIS — C50919 Malignant neoplasm of unspecified site of unspecified female breast: Secondary | ICD-10-CM | POA: Diagnosis not present

## 2021-10-31 DIAGNOSIS — Z13220 Encounter for screening for lipoid disorders: Secondary | ICD-10-CM | POA: Diagnosis not present

## 2021-11-11 DIAGNOSIS — C50412 Malignant neoplasm of upper-outer quadrant of left female breast: Secondary | ICD-10-CM | POA: Diagnosis not present

## 2021-11-11 DIAGNOSIS — Z170 Estrogen receptor positive status [ER+]: Secondary | ICD-10-CM | POA: Diagnosis not present

## 2021-11-24 DIAGNOSIS — C50912 Malignant neoplasm of unspecified site of left female breast: Secondary | ICD-10-CM | POA: Diagnosis not present

## 2021-11-24 DIAGNOSIS — C50412 Malignant neoplasm of upper-outer quadrant of left female breast: Secondary | ICD-10-CM | POA: Diagnosis not present

## 2021-11-24 DIAGNOSIS — Z853 Personal history of malignant neoplasm of breast: Secondary | ICD-10-CM | POA: Diagnosis not present

## 2021-11-24 DIAGNOSIS — Z170 Estrogen receptor positive status [ER+]: Secondary | ICD-10-CM | POA: Diagnosis not present

## 2021-12-06 ENCOUNTER — Other Ambulatory Visit (HOSPITAL_COMMUNITY): Payer: Self-pay

## 2021-12-07 ENCOUNTER — Other Ambulatory Visit (HOSPITAL_COMMUNITY): Payer: Self-pay

## 2021-12-07 IMAGING — DX DG CHEST 2V
2 series · 2 of 2 positions shown · non-contrast
Comparison: None.

CLINICAL DATA: Palpitations.  Recent treatment for breast cancer.

EXAM:
CHEST - 2 VIEW

[chest pa]
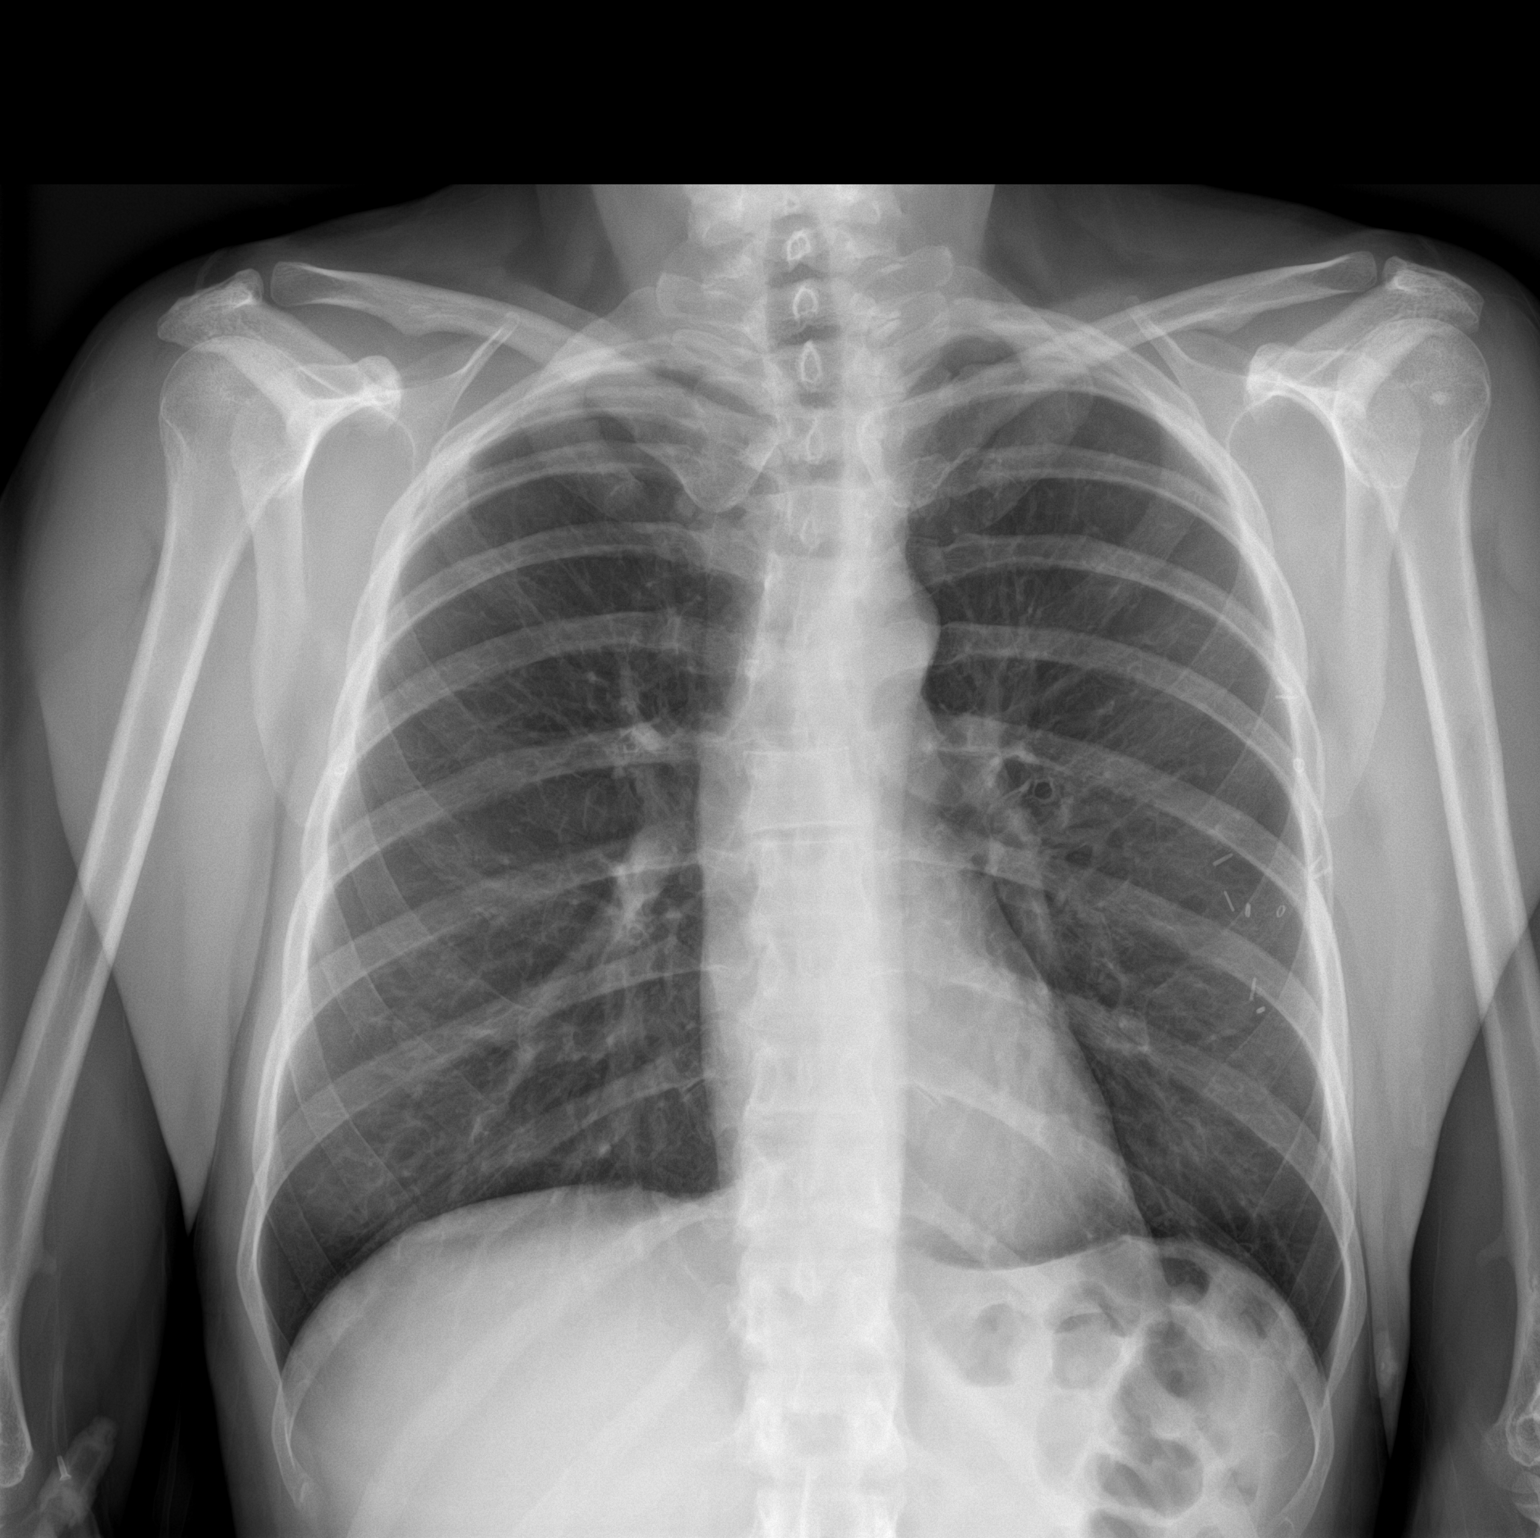

[chest lat]
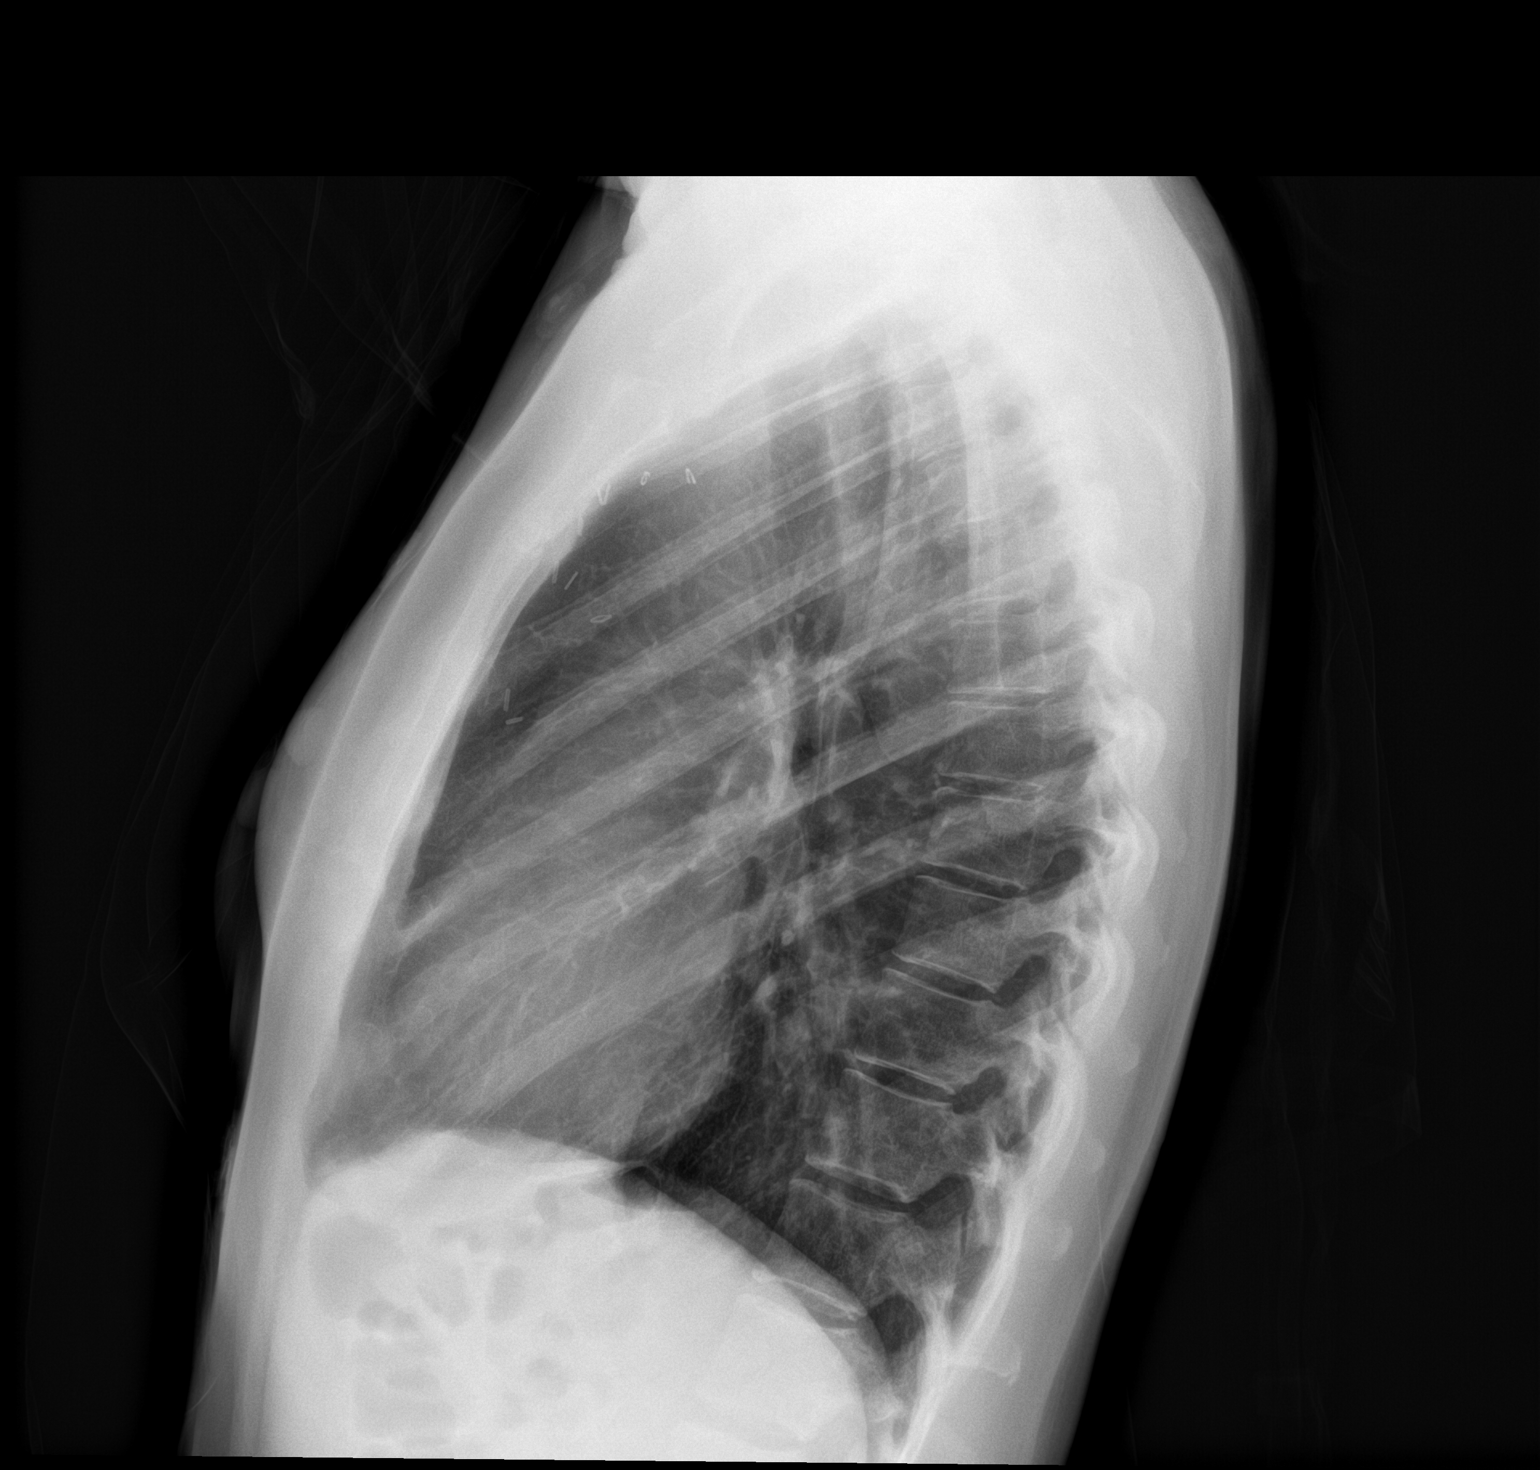

[2 of 2 positions shown; findings below may reference images not displayed]

FINDINGS: Heart size is normal. Lungs are clear. Surgical clips overlie the
LEFT anterior chest wall/LOWER axilla. Visualized osseous structures
have a normal appearance.
IMPRESSION: For

## 2021-12-07 MED ORDER — OXYBUTYNIN CHLORIDE 5 MG PO TABS
2.5000 mg | ORAL_TABLET | Freq: Two times a day (BID) | ORAL | 6 refills | Status: AC
  Filled 2021-12-07: qty 90, 90d supply, fill #0
  Filled 2022-08-26: qty 90, 90d supply, fill #1

## 2021-12-29 DIAGNOSIS — Z01419 Encounter for gynecological examination (general) (routine) without abnormal findings: Secondary | ICD-10-CM | POA: Diagnosis not present

## 2021-12-29 DIAGNOSIS — Z1211 Encounter for screening for malignant neoplasm of colon: Secondary | ICD-10-CM | POA: Diagnosis not present

## 2021-12-29 DIAGNOSIS — Z79811 Long term (current) use of aromatase inhibitors: Secondary | ICD-10-CM | POA: Diagnosis not present

## 2021-12-29 DIAGNOSIS — E2839 Other primary ovarian failure: Secondary | ICD-10-CM | POA: Diagnosis not present

## 2021-12-29 DIAGNOSIS — Z170 Estrogen receptor positive status [ER+]: Secondary | ICD-10-CM | POA: Diagnosis not present

## 2021-12-29 DIAGNOSIS — Z79899 Other long term (current) drug therapy: Secondary | ICD-10-CM | POA: Diagnosis not present

## 2021-12-29 DIAGNOSIS — C50412 Malignant neoplasm of upper-outer quadrant of left female breast: Secondary | ICD-10-CM | POA: Diagnosis not present

## 2021-12-29 DIAGNOSIS — R232 Flushing: Secondary | ICD-10-CM | POA: Diagnosis not present

## 2021-12-29 DIAGNOSIS — Z1389 Encounter for screening for other disorder: Secondary | ICD-10-CM | POA: Diagnosis not present

## 2021-12-29 DIAGNOSIS — R030 Elevated blood-pressure reading, without diagnosis of hypertension: Secondary | ICD-10-CM | POA: Diagnosis not present

## 2021-12-29 DIAGNOSIS — F419 Anxiety disorder, unspecified: Secondary | ICD-10-CM | POA: Diagnosis not present

## 2022-01-12 DIAGNOSIS — F419 Anxiety disorder, unspecified: Secondary | ICD-10-CM | POA: Diagnosis not present

## 2022-01-12 DIAGNOSIS — Z1211 Encounter for screening for malignant neoplasm of colon: Secondary | ICD-10-CM | POA: Diagnosis not present

## 2022-01-12 DIAGNOSIS — Z800 Family history of malignant neoplasm of digestive organs: Secondary | ICD-10-CM | POA: Diagnosis not present

## 2022-01-12 DIAGNOSIS — K5900 Constipation, unspecified: Secondary | ICD-10-CM | POA: Diagnosis not present

## 2022-01-12 DIAGNOSIS — Z8371 Family history of colonic polyps: Secondary | ICD-10-CM | POA: Diagnosis not present

## 2022-01-16 ENCOUNTER — Other Ambulatory Visit (HOSPITAL_COMMUNITY): Payer: Self-pay

## 2022-01-26 DIAGNOSIS — C50412 Malignant neoplasm of upper-outer quadrant of left female breast: Secondary | ICD-10-CM | POA: Diagnosis not present

## 2022-01-26 DIAGNOSIS — Z170 Estrogen receptor positive status [ER+]: Secondary | ICD-10-CM | POA: Diagnosis not present

## 2022-02-08 ENCOUNTER — Other Ambulatory Visit: Payer: Self-pay

## 2022-02-17 DIAGNOSIS — Z170 Estrogen receptor positive status [ER+]: Secondary | ICD-10-CM | POA: Diagnosis not present

## 2022-02-17 DIAGNOSIS — C50412 Malignant neoplasm of upper-outer quadrant of left female breast: Secondary | ICD-10-CM | POA: Diagnosis not present

## 2022-03-01 DIAGNOSIS — L814 Other melanin hyperpigmentation: Secondary | ICD-10-CM | POA: Diagnosis not present

## 2022-03-01 DIAGNOSIS — D225 Melanocytic nevi of trunk: Secondary | ICD-10-CM | POA: Diagnosis not present

## 2022-03-01 DIAGNOSIS — L570 Actinic keratosis: Secondary | ICD-10-CM | POA: Diagnosis not present

## 2022-03-01 DIAGNOSIS — L821 Other seborrheic keratosis: Secondary | ICD-10-CM | POA: Diagnosis not present

## 2022-03-01 DIAGNOSIS — L578 Other skin changes due to chronic exposure to nonionizing radiation: Secondary | ICD-10-CM | POA: Diagnosis not present

## 2022-03-01 DIAGNOSIS — Z411 Encounter for cosmetic surgery: Secondary | ICD-10-CM | POA: Diagnosis not present

## 2022-03-20 ENCOUNTER — Other Ambulatory Visit (HOSPITAL_COMMUNITY): Payer: Self-pay

## 2022-04-03 ENCOUNTER — Other Ambulatory Visit (HOSPITAL_COMMUNITY): Payer: Self-pay

## 2022-04-03 DIAGNOSIS — H5213 Myopia, bilateral: Secondary | ICD-10-CM | POA: Diagnosis not present

## 2022-04-03 MED ORDER — CLENPIQ 10-3.5-12 MG-GM -GM/175ML PO SOLN
ORAL | 0 refills | Status: DC
  Filled 2022-04-03: qty 350, 1d supply, fill #0

## 2022-04-04 ENCOUNTER — Other Ambulatory Visit (HOSPITAL_COMMUNITY): Payer: Self-pay

## 2022-04-04 MED ORDER — ANASTROZOLE 1 MG PO TABS
1.0000 mg | ORAL_TABLET | Freq: Every day | ORAL | 3 refills | Status: DC
  Filled 2022-04-04: qty 90, 90d supply, fill #0
  Filled 2022-07-12: qty 90, 90d supply, fill #1
  Filled 2022-10-05: qty 90, 90d supply, fill #2
  Filled 2023-01-15: qty 90, 90d supply, fill #3

## 2022-04-05 ENCOUNTER — Other Ambulatory Visit (HOSPITAL_COMMUNITY): Payer: Self-pay

## 2022-04-20 ENCOUNTER — Telehealth: Payer: 59 | Admitting: Emergency Medicine

## 2022-04-20 ENCOUNTER — Other Ambulatory Visit: Payer: Self-pay

## 2022-04-20 DIAGNOSIS — R3989 Other symptoms and signs involving the genitourinary system: Secondary | ICD-10-CM | POA: Diagnosis not present

## 2022-04-20 MED ORDER — NITROFURANTOIN MONOHYD MACRO 100 MG PO CAPS
100.0000 mg | ORAL_CAPSULE | Freq: Two times a day (BID) | ORAL | 0 refills | Status: DC
  Filled 2022-04-20: qty 10, 5d supply, fill #0

## 2022-04-20 NOTE — Progress Notes (Signed)
E-Visit for Urinary Problems  We are sorry that you are not feeling well.  Here is how we plan to help!  Based on what you shared with me it looks like you most likely have a simple urinary tract infection. However, women who are perimenopausal or post-menopausal can get UTI symptoms from hormone changes. If you feel you are getting recurrent UTI symptoms, talk with your GYN or primary care provider about vaginal estrogen.   A UTI (Urinary Tract Infection) is a bacterial infection of the bladder.  Most cases of urinary tract infections are simple to treat but a key part of your care is to encourage you to drink plenty of fluids and watch your symptoms carefully.  I have prescribed MacroBid 100 mg twice a day for 5 days.  Your symptoms should gradually improve. Call us if the burning in your urine worsens, you develop worsening fever, back pain or pelvic pain or if your symptoms do not resolve after completing the antibiotic.  Urinary tract infections can be prevented by drinking plenty of water to keep your body hydrated.  Also be sure when you wipe, wipe from front to back and don't hold it in!  If possible, empty your bladder every 4 hours.  HOME CARE Drink plenty of fluids Compete the full course of the antibiotics even if the symptoms resolve Remember, when you need to go.go. Holding in your urine can increase the likelihood of getting a UTI! GET HELP RIGHT AWAY IF: You cannot urinate You get a high fever Worsening back pain occurs You see blood in your urine You feel sick to your stomach or throw up You feel like you are going to pass out  MAKE SURE YOU  Understand these instructions. Will watch your condition. Will get help right away if you are not doing well or get worse.   Thank you for choosing an e-visit.  Your e-visit answers were reviewed by a board certified advanced clinical practitioner to complete your personal care plan. Depending upon the condition, your plan  could have included both over the counter or prescription medications.  Please review your pharmacy choice. Make sure the pharmacy is open so you can pick up prescription now. If there is a problem, you may contact your provider through [employer] and have the prescription routed to another pharmacy.  Your safety is important to [place]. If you have drug allergies check your prescription carefully.   For the next 24 hours you can use MyChart to ask questions about today's visit, request a non-urgent call back, or ask for a work or school excuse. You will get an email in the next two days asking about your experience. I hope that your e-visit has been valuable and will speed your recovery.  I have spent 5 minutes in review of e-visit questionnaire, review and updating patient chart, medical decision making and response to patient.   Willeen Cass, PhD, FNP-BC

## 2022-04-24 DIAGNOSIS — Z79811 Long term (current) use of aromatase inhibitors: Secondary | ICD-10-CM | POA: Diagnosis not present

## 2022-04-24 DIAGNOSIS — R232 Flushing: Secondary | ICD-10-CM | POA: Diagnosis not present

## 2022-04-24 DIAGNOSIS — I10 Essential (primary) hypertension: Secondary | ICD-10-CM | POA: Diagnosis not present

## 2022-04-24 DIAGNOSIS — F419 Anxiety disorder, unspecified: Secondary | ICD-10-CM | POA: Diagnosis not present

## 2022-04-24 DIAGNOSIS — T451X5A Adverse effect of antineoplastic and immunosuppressive drugs, initial encounter: Secondary | ICD-10-CM | POA: Diagnosis not present

## 2022-04-24 DIAGNOSIS — C50412 Malignant neoplasm of upper-outer quadrant of left female breast: Secondary | ICD-10-CM | POA: Diagnosis not present

## 2022-04-24 DIAGNOSIS — E2839 Other primary ovarian failure: Secondary | ICD-10-CM | POA: Diagnosis not present

## 2022-04-24 DIAGNOSIS — Z170 Estrogen receptor positive status [ER+]: Secondary | ICD-10-CM | POA: Diagnosis not present

## 2022-05-16 ENCOUNTER — Other Ambulatory Visit (HOSPITAL_COMMUNITY): Payer: Self-pay

## 2022-05-19 DIAGNOSIS — K635 Polyp of colon: Secondary | ICD-10-CM | POA: Diagnosis not present

## 2022-05-19 DIAGNOSIS — Z1211 Encounter for screening for malignant neoplasm of colon: Secondary | ICD-10-CM | POA: Diagnosis not present

## 2022-05-19 DIAGNOSIS — K648 Other hemorrhoids: Secondary | ICD-10-CM | POA: Diagnosis not present

## 2022-05-19 DIAGNOSIS — Z83719 Family history of colon polyps, unspecified: Secondary | ICD-10-CM | POA: Diagnosis not present

## 2022-05-19 DIAGNOSIS — D123 Benign neoplasm of transverse colon: Secondary | ICD-10-CM | POA: Diagnosis not present

## 2022-06-23 ENCOUNTER — Other Ambulatory Visit (HOSPITAL_COMMUNITY): Payer: Self-pay

## 2022-06-23 MED ORDER — OXYCODONE HCL 5 MG PO TABS
5.0000 mg | ORAL_TABLET | ORAL | 0 refills | Status: DC
  Filled 2022-06-23: qty 10, 2d supply, fill #0

## 2022-07-11 DIAGNOSIS — Z4002 Encounter for prophylactic removal of ovary: Secondary | ICD-10-CM | POA: Diagnosis not present

## 2022-07-11 DIAGNOSIS — Z853 Personal history of malignant neoplasm of breast: Secondary | ICD-10-CM | POA: Diagnosis not present

## 2022-07-11 DIAGNOSIS — Z170 Estrogen receptor positive status [ER+]: Secondary | ICD-10-CM | POA: Diagnosis not present

## 2022-07-11 DIAGNOSIS — Z9079 Acquired absence of other genital organ(s): Secondary | ICD-10-CM | POA: Diagnosis not present

## 2022-07-11 DIAGNOSIS — Z79899 Other long term (current) drug therapy: Secondary | ICD-10-CM | POA: Diagnosis not present

## 2022-07-11 DIAGNOSIS — Z923 Personal history of irradiation: Secondary | ICD-10-CM | POA: Diagnosis not present

## 2022-07-11 DIAGNOSIS — Z8616 Personal history of COVID-19: Secondary | ICD-10-CM | POA: Diagnosis not present

## 2022-07-11 DIAGNOSIS — C50412 Malignant neoplasm of upper-outer quadrant of left female breast: Secondary | ICD-10-CM | POA: Diagnosis not present

## 2022-07-11 DIAGNOSIS — Z90710 Acquired absence of both cervix and uterus: Secondary | ICD-10-CM | POA: Diagnosis not present

## 2022-07-11 DIAGNOSIS — Z9889 Other specified postprocedural states: Secondary | ICD-10-CM | POA: Diagnosis not present

## 2022-07-12 ENCOUNTER — Telehealth: Payer: Commercial Managed Care - PPO | Admitting: Physician Assistant

## 2022-07-12 ENCOUNTER — Other Ambulatory Visit: Payer: Self-pay

## 2022-07-12 ENCOUNTER — Other Ambulatory Visit (HOSPITAL_COMMUNITY): Payer: Self-pay

## 2022-07-12 DIAGNOSIS — R3989 Other symptoms and signs involving the genitourinary system: Secondary | ICD-10-CM

## 2022-07-12 NOTE — Progress Notes (Signed)
Because you just recently had a surgical procedure yesterday, I feel your condition warrants further evaluation and I recommend that you be seen in a face to face visit.   NOTE: There will be NO CHARGE for this eVisit   If you are having a true medical emergency please call 911.      For an urgent face to face visit, [facility] has eight urgent care centers for your convenience:   NEW!! [facility] [place] [facility] [place] Get Driving Directions [id number] [address] [place], [place] [facility] [place] [facility] [place] Get Driving Directions [id number] [place], [place] [address]   [facility]) Get Driving Directions [id number] [address], [place] [address]  [facility] [place] ([place]) Get Driving Directions [id number] [address] [place],  [place]  [address]  [facility] [place] [facility] - at [place] Get Driving Directions  [phone] [address] [place],  [place] [address]   [facility] Get Driving Directions [id number] [place], [place], [place] [address]   [facility] Get Driving Directions  [id number] [address] [place], [place] [address]   [facility] [place] Get Driving Directions [id number] [address] [place], [place] [address]  Your MyChart E-visit questionnaire answers were reviewed by a board certified advanced clinical practitioner to complete your personal care plan based on your specific symptoms.  Thank you for using e-Visits.    I have spent 5 minutes in review of e-visit questionnaire, review and updating patient chart, medical decision making and response to patient.   Mar Daring, PA-C

## 2022-07-14 ENCOUNTER — Encounter: Payer: Self-pay | Admitting: Podiatry

## 2022-07-14 ENCOUNTER — Ambulatory Visit (INDEPENDENT_AMBULATORY_CARE_PROVIDER_SITE_OTHER): Payer: 59 | Admitting: Podiatry

## 2022-07-14 ENCOUNTER — Other Ambulatory Visit (HOSPITAL_COMMUNITY): Payer: Self-pay

## 2022-07-14 ENCOUNTER — Ambulatory Visit: Payer: 59

## 2022-07-14 DIAGNOSIS — R52 Pain, unspecified: Secondary | ICD-10-CM | POA: Diagnosis not present

## 2022-07-14 DIAGNOSIS — M722 Plantar fascial fibromatosis: Secondary | ICD-10-CM | POA: Diagnosis not present

## 2022-07-14 MED ORDER — METHYLPREDNISOLONE 4 MG PO TBPK
ORAL_TABLET | ORAL | 0 refills | Status: DC
  Filled 2022-07-14: qty 21, 6d supply, fill #0

## 2022-07-14 MED ORDER — BETAMETHASONE SOD PHOS & ACET 6 (3-3) MG/ML IJ SUSP
3.0000 mg | Freq: Once | INTRAMUSCULAR | Status: AC
  Administered 2022-07-14: 3 mg via INTRA_ARTICULAR

## 2022-07-14 NOTE — Progress Notes (Signed)
Chief Complaint  Patient presents with   Plantar Fasciitis    R foot PF, nerve pain on heel.    Subjective: 47 y.o. female presenting today for onset of pain and tenderness associated to the right plantar heel.  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  Gradual onset over the past month.  She says that she has been increasing her exercise.  She wears good shoes and Oofos sandals around the house.  Patient does relate a shooting type sensation when she stretches her ankle which begins around the plantar fascia and medial aspect of the heel.   Past Medical History:  Diagnosis Date   Blood type O-    Cancer ([place])    Complication of anesthesia    bp dropped with 1st spinal   Menorrhagia    Spontaneous abortion    END OF [id number], 0000000   Umbilical hernia    Uterine fibroid    Past Surgical History:  Procedure Laterality Date   CESAREAN SECTION     X3   HYSTEROSCOPY  03/25/2009   HYSTEROSCOPIC IUD REMOVAL   LAPAROSCOPIC HYSTERECTOMY N/A 11/28/2018   Procedure: HYSTERECTOMY TOTAL LAPAROSCOPIC;  Surgeon: Malachy Mood, MD;  Location: [facility] ORS;  Service: Gynecology;  Laterality: N/A;   LAPAROSCOPY N/A 11/30/2018   Procedure: LAPAROSCOPY OPERATIVE,cystoscopy,bladder repair,retrograde pyelogram;  Surgeon: Gae Dry, MD;  Location: [facility];  Service: Gynecology;  Laterality: N/A;   UMBILICAL HERNIA REPAIR N/A 11/28/2018   Procedure: HERNIA REPAIR UMBILICAL ADULT;  Surgeon: Jules Husbands, MD;  Location: [facility] ORS;  Service: General;  Laterality: N/A;   WISDOM TOOTH EXTRACTION     Allergies  Allergen Reactions   Guaifenesin Palpitations   Bactrim [Sulfamethoxazole-Trimethoprim]     Yeast infection and diarrhea   Erythromycin Nausea And Vomiting and Other (See Comments)     Objective: Physical Exam General: The patient is alert and oriented x3 in no acute distress.  Dermatology: Skin is warm, dry and supple bilateral lower extremities.  Negative for open lesions or macerations bilateral.   Vascular: Dorsalis Pedis and Posterior Tibial pulses palpable bilateral.  Capillary fill time is immediate to all digits.  Neurological: Epicritic and protective threshold intact bilateral.   Musculoskeletal: Tenderness to palpation to the plantar aspect of the right heel along the plantar fascia. All other joints range of motion within normal limits bilateral. Strength 5/5 in all groups bilateral.   Assessment: 1. Plantar fasciitis right 2.  Neuritis right plantar heel  Plan of Care:  1. Patient evaluated. Xrays reviewed.   2. Injection of 0.5cc Celestone soluspan injected into the right plantar fascia  3. Rx for Medrol Dose Pack placed.  Patient had recent laparoscopic ovarian resection surgery earlier this week.  Advised that she does not take the Medrol Dosepak until she is cleared and approved to do so by her surgeon 4.  Patient has been taking Motrin 800 mg postoperatively.  Continue 5.  Continue wearing good part of shoes and sneakers 6. Instructed patient regarding therapies and modalities at home to alleviate symptoms.  7. Return to clinic as needed  *RN for inpatient DM education at all [facility]  Edrick Kins, DPM [facility]  Dr. Edrick Kins, DPM    [address]  [place], [place] [address]                Office [phone]  Fax [phone]

## 2022-07-17 ENCOUNTER — Other Ambulatory Visit (HOSPITAL_COMMUNITY): Payer: Self-pay

## 2022-08-26 ENCOUNTER — Other Ambulatory Visit (HOSPITAL_COMMUNITY): Payer: Self-pay

## 2022-08-28 ENCOUNTER — Other Ambulatory Visit (HOSPITAL_COMMUNITY): Payer: Self-pay

## 2022-08-29 ENCOUNTER — Other Ambulatory Visit: Payer: Self-pay

## 2022-08-29 ENCOUNTER — Other Ambulatory Visit (HOSPITAL_COMMUNITY): Payer: Self-pay

## 2022-08-29 MED ORDER — ESCITALOPRAM OXALATE 20 MG PO TABS
20.0000 mg | ORAL_TABLET | Freq: Every day | ORAL | 0 refills | Status: DC
  Filled 2022-08-29: qty 90, 90d supply, fill #0

## 2022-10-05 ENCOUNTER — Other Ambulatory Visit (HOSPITAL_COMMUNITY): Payer: Self-pay

## 2022-10-05 DIAGNOSIS — Z853 Personal history of malignant neoplasm of breast: Secondary | ICD-10-CM | POA: Diagnosis not present

## 2022-10-05 DIAGNOSIS — R079 Chest pain, unspecified: Secondary | ICD-10-CM | POA: Diagnosis not present

## 2022-10-05 DIAGNOSIS — F419 Anxiety disorder, unspecified: Secondary | ICD-10-CM | POA: Diagnosis not present

## 2022-10-05 MED ORDER — FAMOTIDINE 20 MG PO TABS
20.0000 mg | ORAL_TABLET | Freq: Two times a day (BID) | ORAL | 0 refills | Status: DC
  Filled 2022-10-05: qty 28, 14d supply, fill #0

## 2022-10-17 ENCOUNTER — Other Ambulatory Visit (HOSPITAL_COMMUNITY): Payer: Self-pay

## 2022-10-17 DIAGNOSIS — Z5181 Encounter for therapeutic drug level monitoring: Secondary | ICD-10-CM | POA: Diagnosis not present

## 2022-10-17 DIAGNOSIS — Z170 Estrogen receptor positive status [ER+]: Secondary | ICD-10-CM | POA: Diagnosis not present

## 2022-10-17 DIAGNOSIS — R232 Flushing: Secondary | ICD-10-CM | POA: Diagnosis not present

## 2022-10-17 DIAGNOSIS — C50412 Malignant neoplasm of upper-outer quadrant of left female breast: Secondary | ICD-10-CM | POA: Diagnosis not present

## 2022-10-17 DIAGNOSIS — Z923 Personal history of irradiation: Secondary | ICD-10-CM | POA: Diagnosis not present

## 2022-10-17 DIAGNOSIS — Z1379 Encounter for other screening for genetic and chromosomal anomalies: Secondary | ICD-10-CM | POA: Diagnosis not present

## 2022-10-17 DIAGNOSIS — T451X5A Adverse effect of antineoplastic and immunosuppressive drugs, initial encounter: Secondary | ICD-10-CM | POA: Diagnosis not present

## 2022-10-17 DIAGNOSIS — E8940 Asymptomatic postprocedural ovarian failure: Secondary | ICD-10-CM | POA: Diagnosis not present

## 2022-10-17 DIAGNOSIS — Z79811 Long term (current) use of aromatase inhibitors: Secondary | ICD-10-CM | POA: Diagnosis not present

## 2022-10-17 DIAGNOSIS — Z9189 Other specified personal risk factors, not elsewhere classified: Secondary | ICD-10-CM | POA: Diagnosis not present

## 2022-10-17 MED ORDER — OMEPRAZOLE 20 MG PO CPDR
20.0000 mg | DELAYED_RELEASE_CAPSULE | Freq: Two times a day (BID) | ORAL | 0 refills | Status: DC
  Filled 2022-10-17: qty 14, 7d supply, fill #0

## 2022-11-02 ENCOUNTER — Other Ambulatory Visit (HOSPITAL_COMMUNITY): Payer: Self-pay

## 2022-11-02 DIAGNOSIS — K219 Gastro-esophageal reflux disease without esophagitis: Secondary | ICD-10-CM | POA: Diagnosis not present

## 2022-11-02 DIAGNOSIS — F419 Anxiety disorder, unspecified: Secondary | ICD-10-CM | POA: Diagnosis not present

## 2022-11-02 DIAGNOSIS — E7800 Pure hypercholesterolemia, unspecified: Secondary | ICD-10-CM | POA: Diagnosis not present

## 2022-11-02 DIAGNOSIS — Z853 Personal history of malignant neoplasm of breast: Secondary | ICD-10-CM | POA: Diagnosis not present

## 2022-11-02 DIAGNOSIS — Z0000 Encounter for general adult medical examination without abnormal findings: Secondary | ICD-10-CM | POA: Diagnosis not present

## 2022-11-02 DIAGNOSIS — Z131 Encounter for screening for diabetes mellitus: Secondary | ICD-10-CM | POA: Diagnosis not present

## 2022-11-02 MED ORDER — OMEPRAZOLE 20 MG PO CPDR
20.0000 mg | DELAYED_RELEASE_CAPSULE | Freq: Two times a day (BID) | ORAL | 0 refills | Status: DC
  Filled 2022-11-02: qty 60, 30d supply, fill #0

## 2022-11-07 ENCOUNTER — Other Ambulatory Visit (HOSPITAL_COMMUNITY): Payer: Self-pay

## 2022-11-21 ENCOUNTER — Other Ambulatory Visit (HOSPITAL_COMMUNITY): Payer: Self-pay

## 2022-11-21 MED ORDER — OMEPRAZOLE 20 MG PO CPDR
20.0000 mg | DELAYED_RELEASE_CAPSULE | Freq: Two times a day (BID) | ORAL | 0 refills | Status: AC
  Filled 2022-11-21 – 2023-04-26 (×4): qty 180, 90d supply, fill #0

## 2022-11-27 ENCOUNTER — Other Ambulatory Visit: Payer: Self-pay

## 2022-11-30 DIAGNOSIS — C50912 Malignant neoplasm of unspecified site of left female breast: Secondary | ICD-10-CM | POA: Diagnosis not present

## 2022-11-30 DIAGNOSIS — Z170 Estrogen receptor positive status [ER+]: Secondary | ICD-10-CM | POA: Diagnosis not present

## 2022-11-30 DIAGNOSIS — R928 Other abnormal and inconclusive findings on diagnostic imaging of breast: Secondary | ICD-10-CM | POA: Diagnosis not present

## 2022-11-30 DIAGNOSIS — Z853 Personal history of malignant neoplasm of breast: Secondary | ICD-10-CM | POA: Diagnosis not present

## 2022-11-30 DIAGNOSIS — C50412 Malignant neoplasm of upper-outer quadrant of left female breast: Secondary | ICD-10-CM | POA: Diagnosis not present

## 2023-01-26 DIAGNOSIS — Z13820 Encounter for screening for osteoporosis: Secondary | ICD-10-CM | POA: Diagnosis not present

## 2023-01-26 DIAGNOSIS — Z9189 Other specified personal risk factors, not elsewhere classified: Secondary | ICD-10-CM | POA: Diagnosis not present

## 2023-02-14 ENCOUNTER — Other Ambulatory Visit (HOSPITAL_COMMUNITY): Payer: Self-pay

## 2023-02-14 MED ORDER — ESCITALOPRAM OXALATE 10 MG PO TABS
10.0000 mg | ORAL_TABLET | Freq: Every day | ORAL | 2 refills | Status: DC
  Filled 2023-02-14: qty 90, 90d supply, fill #0
  Filled 2023-05-30: qty 90, 90d supply, fill #1

## 2023-03-20 DIAGNOSIS — L738 Other specified follicular disorders: Secondary | ICD-10-CM | POA: Diagnosis not present

## 2023-03-20 DIAGNOSIS — L814 Other melanin hyperpigmentation: Secondary | ICD-10-CM | POA: Diagnosis not present

## 2023-03-20 DIAGNOSIS — L821 Other seborrheic keratosis: Secondary | ICD-10-CM | POA: Diagnosis not present

## 2023-03-20 DIAGNOSIS — D2230 Melanocytic nevi of unspecified part of face: Secondary | ICD-10-CM | POA: Diagnosis not present

## 2023-03-20 DIAGNOSIS — L578 Other skin changes due to chronic exposure to nonionizing radiation: Secondary | ICD-10-CM | POA: Diagnosis not present

## 2023-03-20 DIAGNOSIS — D225 Melanocytic nevi of trunk: Secondary | ICD-10-CM | POA: Diagnosis not present

## 2023-03-20 DIAGNOSIS — Z411 Encounter for cosmetic surgery: Secondary | ICD-10-CM | POA: Diagnosis not present

## 2023-04-26 ENCOUNTER — Other Ambulatory Visit (HOSPITAL_COMMUNITY): Payer: Self-pay

## 2023-04-26 ENCOUNTER — Other Ambulatory Visit: Payer: Self-pay

## 2023-04-26 MED ORDER — ESCITALOPRAM OXALATE 10 MG PO TABS
10.0000 mg | ORAL_TABLET | Freq: Every day | ORAL | 1 refills | Status: AC
  Filled 2023-04-26: qty 100, 100d supply, fill #0
  Filled 2023-09-10: qty 90, 90d supply, fill #0
  Filled 2023-12-14: qty 90, 90d supply, fill #1

## 2023-04-27 ENCOUNTER — Other Ambulatory Visit (HOSPITAL_COMMUNITY): Payer: Self-pay

## 2023-04-27 MED ORDER — ANASTROZOLE 1 MG PO TABS
1.0000 mg | ORAL_TABLET | Freq: Every day | ORAL | 3 refills | Status: AC
  Filled 2023-04-27: qty 4, 4d supply, fill #0
  Filled 2023-04-27: qty 90, 90d supply, fill #0
  Filled 2023-07-28: qty 90, 90d supply, fill #1
  Filled 2023-11-10: qty 90, 90d supply, fill #2
  Filled 2024-02-07: qty 90, 90d supply, fill #3

## 2023-04-28 ENCOUNTER — Telehealth: Payer: 59 | Admitting: Physician Assistant

## 2023-04-28 DIAGNOSIS — R3989 Other symptoms and signs involving the genitourinary system: Secondary | ICD-10-CM | POA: Diagnosis not present

## 2023-04-28 MED ORDER — CEPHALEXIN 500 MG PO CAPS: 500.0000 mg | ORAL_CAPSULE | Freq: Two times a day (BID) | ORAL | 0 refills | Status: AC

## 2023-04-28 NOTE — Progress Notes (Signed)
I have spent 5 minutes in review of e-visit questionnaire, review and updating patient chart, medical decision making and response to patient.   Laure Kidney, PA-C

## 2023-04-28 NOTE — Progress Notes (Signed)

## 2023-05-13 ENCOUNTER — Other Ambulatory Visit (HOSPITAL_COMMUNITY): Payer: Self-pay

## 2023-05-14 ENCOUNTER — Encounter (HOSPITAL_COMMUNITY): Payer: Self-pay

## 2023-05-14 ENCOUNTER — Other Ambulatory Visit (HOSPITAL_COMMUNITY): Payer: Self-pay

## 2023-05-14 DIAGNOSIS — Z923 Personal history of irradiation: Secondary | ICD-10-CM | POA: Diagnosis not present

## 2023-05-14 DIAGNOSIS — R6882 Decreased libido: Secondary | ICD-10-CM | POA: Diagnosis not present

## 2023-05-14 DIAGNOSIS — C50912 Malignant neoplasm of unspecified site of left female breast: Secondary | ICD-10-CM | POA: Diagnosis not present

## 2023-05-14 DIAGNOSIS — Z170 Estrogen receptor positive status [ER+]: Secondary | ICD-10-CM | POA: Diagnosis not present

## 2023-05-14 DIAGNOSIS — Z6826 Body mass index (BMI) 26.0-26.9, adult: Secondary | ICD-10-CM | POA: Diagnosis not present

## 2023-05-14 DIAGNOSIS — M2550 Pain in unspecified joint: Secondary | ICD-10-CM | POA: Diagnosis not present

## 2023-05-14 DIAGNOSIS — N951 Menopausal and female climacteric states: Secondary | ICD-10-CM | POA: Diagnosis not present

## 2023-05-14 DIAGNOSIS — R635 Abnormal weight gain: Secondary | ICD-10-CM | POA: Diagnosis not present

## 2023-05-14 DIAGNOSIS — Z23 Encounter for immunization: Secondary | ICD-10-CM | POA: Diagnosis not present

## 2023-05-14 DIAGNOSIS — F419 Anxiety disorder, unspecified: Secondary | ICD-10-CM | POA: Diagnosis not present

## 2023-05-14 DIAGNOSIS — C50412 Malignant neoplasm of upper-outer quadrant of left female breast: Secondary | ICD-10-CM | POA: Diagnosis not present

## 2023-05-14 DIAGNOSIS — Z7182 Exercise counseling: Secondary | ICD-10-CM | POA: Diagnosis not present

## 2023-05-14 MED ORDER — CLONAZEPAM 0.5 MG PO TABS
0.2500 mg | ORAL_TABLET | Freq: Every day | ORAL | 0 refills | Status: AC | PRN
  Filled 2023-05-14: qty 15, 30d supply, fill #0

## 2023-05-15 DIAGNOSIS — H5213 Myopia, bilateral: Secondary | ICD-10-CM | POA: Diagnosis not present

## 2023-05-25 DIAGNOSIS — N951 Menopausal and female climacteric states: Secondary | ICD-10-CM | POA: Diagnosis not present

## 2023-05-25 DIAGNOSIS — Z6826 Body mass index (BMI) 26.0-26.9, adult: Secondary | ICD-10-CM | POA: Diagnosis not present

## 2023-05-25 DIAGNOSIS — Z01419 Encounter for gynecological examination (general) (routine) without abnormal findings: Secondary | ICD-10-CM | POA: Diagnosis not present

## 2023-05-25 DIAGNOSIS — Z1272 Encounter for screening for malignant neoplasm of vagina: Secondary | ICD-10-CM | POA: Diagnosis not present

## 2023-06-11 ENCOUNTER — Telehealth: Payer: Commercial Managed Care - PPO | Admitting: Nurse Practitioner

## 2023-06-11 DIAGNOSIS — R3989 Other symptoms and signs involving the genitourinary system: Secondary | ICD-10-CM

## 2023-06-11 MED ORDER — CEPHALEXIN 500 MG PO CAPS: 500.0000 mg | ORAL_CAPSULE | Freq: Two times a day (BID) | ORAL | 0 refills | Status: AC

## 2023-06-11 NOTE — Progress Notes (Signed)

## 2023-06-20 DIAGNOSIS — Z713 Dietary counseling and surveillance: Secondary | ICD-10-CM | POA: Diagnosis not present

## 2023-06-20 DIAGNOSIS — Z6825 Body mass index (BMI) 25.0-25.9, adult: Secondary | ICD-10-CM | POA: Diagnosis not present

## 2023-07-03 ENCOUNTER — Telehealth: Payer: Commercial Managed Care - PPO | Admitting: Nurse Practitioner

## 2023-07-03 DIAGNOSIS — N390 Urinary tract infection, site not specified: Secondary | ICD-10-CM

## 2023-07-03 NOTE — Progress Notes (Signed)
 Because you were recently treated our protocol is for you to be seen in person so you can have urine cultures completed, I feel your condition warrants further evaluation and I recommend that you be seen for a face to face visit.  Please contact your primary care physician practice to be seen. Many offices offer virtual options to be seen via video if you are not comfortable going in person to a medical facility at this time.  NOTE: You will NOT be charged for this eVisit.  If you do not have a PCP, [facility] offers a free physician referral service available at [phone]. Our trained staff has the experience, knowledge and resources to put you in touch with a physician who is right for you.    If you are having a true medical emergency please call 911.   Your e-visit answers were reviewed by a board certified advanced clinical practitioner to complete your personal care plan.  Thank you for using e-Visits.

## 2023-07-04 ENCOUNTER — Other Ambulatory Visit (HOSPITAL_COMMUNITY): Payer: Self-pay

## 2023-07-04 DIAGNOSIS — N3000 Acute cystitis without hematuria: Secondary | ICD-10-CM | POA: Diagnosis not present

## 2023-07-04 DIAGNOSIS — R399 Unspecified symptoms and signs involving the genitourinary system: Secondary | ICD-10-CM | POA: Diagnosis not present

## 2023-07-04 MED ORDER — NITROFURANTOIN MONOHYD MACRO 100 MG PO CAPS
100.0000 mg | ORAL_CAPSULE | Freq: Two times a day (BID) | ORAL | 0 refills | Status: AC
Start: 2023-07-04 — End: 2023-07-09
  Filled 2023-07-04: qty 10, 5d supply, fill #0

## 2023-07-24 DIAGNOSIS — F4322 Adjustment disorder with anxiety: Secondary | ICD-10-CM | POA: Diagnosis not present

## 2023-07-31 ENCOUNTER — Other Ambulatory Visit (HOSPITAL_COMMUNITY): Payer: Self-pay

## 2023-07-31 DIAGNOSIS — N390 Urinary tract infection, site not specified: Secondary | ICD-10-CM | POA: Diagnosis not present

## 2023-07-31 MED ORDER — CIPROFLOXACIN HCL 500 MG PO TABS
500.0000 mg | ORAL_TABLET | Freq: Two times a day (BID) | ORAL | 0 refills | Status: AC
  Filled 2023-07-31: qty 14, 7d supply, fill #0

## 2023-08-09 DIAGNOSIS — F4322 Adjustment disorder with anxiety: Secondary | ICD-10-CM | POA: Diagnosis not present

## 2023-08-20 ENCOUNTER — Telehealth: Admitting: Nurse Practitioner

## 2023-08-20 ENCOUNTER — Other Ambulatory Visit (HOSPITAL_COMMUNITY): Payer: Self-pay

## 2023-08-20 DIAGNOSIS — J0140 Acute pansinusitis, unspecified: Secondary | ICD-10-CM

## 2023-08-20 MED ORDER — AMOXICILLIN-POT CLAVULANATE 875-125 MG PO TABS
1.0000 | ORAL_TABLET | Freq: Two times a day (BID) | ORAL | 0 refills | Status: AC
  Filled 2023-08-20: qty 14, 7d supply, fill #0

## 2023-08-20 NOTE — Progress Notes (Signed)
 E-Visit for Sinus Problems  We are sorry that you are not feeling well.  Here is how we plan to help!  Based on what you have shared with me it looks like you have sinusitis.  Sinusitis is inflammation and infection in the sinus cavities of the head.  Based on your presentation I believe you most likely have Acute Bacterial Sinusitis.  This is an infection caused by bacteria and is treated with antibiotics. I have prescribed Augmentin 875mg /125mg  one tablet twice daily with food, for 7 days. You may use an oral decongestant such as Mucinex D or if you have glaucoma or high blood pressure use plain Mucinex. Saline nasal spray help and can safely be used as often as needed for congestion.  If you develop worsening sinus pain, fever or notice severe headache and vision changes, or if symptoms are not better after completion of antibiotic, please schedule an appointment with a health care provider.    Sinus infections are not as easily transmitted as other respiratory infection, however we still recommend that you avoid close contact with loved ones, especially the very young and elderly.  Remember to wash your hands thoroughly throughout the day as this is the number one way to prevent the spread of infection!  Home Care: Only take medications as instructed by your medical team. Complete the entire course of an antibiotic. Do not take these medications with alcohol. A steam or ultrasonic humidifier can help congestion.  You can place a towel over your head and breathe in the steam from hot water coming from a faucet. Avoid close contacts especially the very young and the elderly. Cover your mouth when you cough or sneeze. Always remember to wash your hands.  Get Help Right Away If: You develop worsening fever or sinus pain. You develop a severe head ache or visual changes. Your symptoms persist after you have completed your treatment plan.  Make sure you Understand these instructions. Will watch  your condition. Will get help right away if you are not doing well or get worse.  Thank you for choosing an e-visit.  Your e-visit answers were reviewed by a board certified advanced clinical practitioner to complete your personal care plan. Depending upon the condition, your plan could have included both over the counter or prescription medications.  Please review your pharmacy choice. Make sure the pharmacy is open so you can pick up prescription now. If there is a problem, you may contact your provider through [employer] and have the prescription routed to another pharmacy.  Your safety is important to [place]. If you have drug allergies check your prescription carefully.   For the next 24 hours you can use MyChart to ask questions about today's visit, request a non-urgent call back, or ask for a work or school excuse. You will get an email in the next two days asking about your experience. I hope that your e-visit has been valuable and will speed your recovery.   I spent approximately 5 minutes reviewing the patient's history, current symptoms and coordinating their care today.

## 2023-08-23 DIAGNOSIS — R059 Cough, unspecified: Secondary | ICD-10-CM | POA: Diagnosis not present

## 2023-08-23 DIAGNOSIS — J988 Other specified respiratory disorders: Secondary | ICD-10-CM | POA: Diagnosis not present

## 2023-09-07 ENCOUNTER — Other Ambulatory Visit (HOSPITAL_COMMUNITY): Payer: Self-pay

## 2023-09-07 DIAGNOSIS — Z87440 Personal history of urinary (tract) infections: Secondary | ICD-10-CM | POA: Diagnosis not present

## 2023-09-07 MED ORDER — SULFAMETHOXAZOLE-TRIMETHOPRIM 800-160 MG PO TABS
1.0000 | ORAL_TABLET | ORAL | 3 refills | Status: AC
  Filled 2023-09-07: qty 30, 30d supply, fill #0

## 2023-09-10 ENCOUNTER — Other Ambulatory Visit (HOSPITAL_COMMUNITY): Payer: Self-pay

## 2023-09-12 DIAGNOSIS — F4322 Adjustment disorder with anxiety: Secondary | ICD-10-CM | POA: Diagnosis not present

## 2023-09-19 DIAGNOSIS — F4322 Adjustment disorder with anxiety: Secondary | ICD-10-CM | POA: Diagnosis not present

## 2023-10-22 DIAGNOSIS — Z713 Dietary counseling and surveillance: Secondary | ICD-10-CM | POA: Diagnosis not present

## 2023-10-22 DIAGNOSIS — Z6823 Body mass index (BMI) 23.0-23.9, adult: Secondary | ICD-10-CM | POA: Diagnosis not present

## 2023-10-23 DIAGNOSIS — F4322 Adjustment disorder with anxiety: Secondary | ICD-10-CM | POA: Diagnosis not present

## 2023-11-05 ENCOUNTER — Other Ambulatory Visit (HOSPITAL_COMMUNITY): Payer: Self-pay

## 2023-11-05 DIAGNOSIS — Z0000 Encounter for general adult medical examination without abnormal findings: Secondary | ICD-10-CM | POA: Diagnosis not present

## 2023-11-05 DIAGNOSIS — E7800 Pure hypercholesterolemia, unspecified: Secondary | ICD-10-CM | POA: Diagnosis not present

## 2023-11-05 DIAGNOSIS — N390 Urinary tract infection, site not specified: Secondary | ICD-10-CM | POA: Diagnosis not present

## 2023-11-05 DIAGNOSIS — Z853 Personal history of malignant neoplasm of breast: Secondary | ICD-10-CM | POA: Diagnosis not present

## 2023-11-05 DIAGNOSIS — Z131 Encounter for screening for diabetes mellitus: Secondary | ICD-10-CM | POA: Diagnosis not present

## 2023-11-05 DIAGNOSIS — F419 Anxiety disorder, unspecified: Secondary | ICD-10-CM | POA: Diagnosis not present

## 2023-11-05 DIAGNOSIS — K219 Gastro-esophageal reflux disease without esophagitis: Secondary | ICD-10-CM | POA: Diagnosis not present

## 2023-11-05 MED ORDER — ESCITALOPRAM OXALATE 10 MG PO TABS
10.0000 mg | ORAL_TABLET | Freq: Every day | ORAL | 3 refills | Status: AC
  Filled 2023-11-05: qty 100, 100d supply, fill #0

## 2023-11-07 DIAGNOSIS — F4322 Adjustment disorder with anxiety: Secondary | ICD-10-CM | POA: Diagnosis not present

## 2023-11-19 ENCOUNTER — Other Ambulatory Visit (HOSPITAL_COMMUNITY): Payer: Self-pay

## 2023-11-19 DIAGNOSIS — F4322 Adjustment disorder with anxiety: Secondary | ICD-10-CM | POA: Diagnosis not present

## 2023-12-03 DIAGNOSIS — F4322 Adjustment disorder with anxiety: Secondary | ICD-10-CM | POA: Diagnosis not present

## 2023-12-04 DIAGNOSIS — Z1231 Encounter for screening mammogram for malignant neoplasm of breast: Secondary | ICD-10-CM | POA: Diagnosis not present

## 2023-12-04 DIAGNOSIS — Z170 Estrogen receptor positive status [ER+]: Secondary | ICD-10-CM | POA: Diagnosis not present

## 2023-12-04 DIAGNOSIS — Z0389 Encounter for observation for other suspected diseases and conditions ruled out: Secondary | ICD-10-CM | POA: Diagnosis not present

## 2023-12-04 DIAGNOSIS — Z853 Personal history of malignant neoplasm of breast: Secondary | ICD-10-CM | POA: Diagnosis not present

## 2023-12-04 DIAGNOSIS — C50912 Malignant neoplasm of unspecified site of left female breast: Secondary | ICD-10-CM | POA: Diagnosis not present

## 2023-12-04 DIAGNOSIS — C50412 Malignant neoplasm of upper-outer quadrant of left female breast: Secondary | ICD-10-CM | POA: Diagnosis not present

## 2023-12-19 ENCOUNTER — Other Ambulatory Visit (HOSPITAL_COMMUNITY): Payer: Self-pay

## 2023-12-19 DIAGNOSIS — F4322 Adjustment disorder with anxiety: Secondary | ICD-10-CM | POA: Diagnosis not present

## 2023-12-19 MED ORDER — ESCITALOPRAM OXALATE 20 MG PO TABS
20.0000 mg | ORAL_TABLET | Freq: Every day | ORAL | 0 refills | Status: DC
  Filled 2023-12-19 (×2): qty 90, 90d supply, fill #0

## 2023-12-31 ENCOUNTER — Other Ambulatory Visit (HOSPITAL_COMMUNITY): Payer: Self-pay

## 2023-12-31 MED ORDER — CLONAZEPAM 0.5 MG PO TABS
0.2500 mg | ORAL_TABLET | Freq: Every day | ORAL | 0 refills | Status: AC | PRN
  Filled 2023-12-31: qty 15, 30d supply, fill #0

## 2024-01-03 DIAGNOSIS — F4322 Adjustment disorder with anxiety: Secondary | ICD-10-CM | POA: Diagnosis not present

## 2024-01-08 DIAGNOSIS — F4322 Adjustment disorder with anxiety: Secondary | ICD-10-CM | POA: Diagnosis not present

## 2024-01-10 DIAGNOSIS — Z713 Dietary counseling and surveillance: Secondary | ICD-10-CM | POA: Diagnosis not present

## 2024-01-10 DIAGNOSIS — Z6822 Body mass index (BMI) 22.0-22.9, adult: Secondary | ICD-10-CM | POA: Diagnosis not present

## 2024-01-14 DIAGNOSIS — F4322 Adjustment disorder with anxiety: Secondary | ICD-10-CM | POA: Diagnosis not present

## 2024-01-22 DIAGNOSIS — F4322 Adjustment disorder with anxiety: Secondary | ICD-10-CM | POA: Diagnosis not present

## 2024-01-29 DIAGNOSIS — F4322 Adjustment disorder with anxiety: Secondary | ICD-10-CM | POA: Diagnosis not present

## 2024-02-06 DIAGNOSIS — F4322 Adjustment disorder with anxiety: Secondary | ICD-10-CM | POA: Diagnosis not present

## 2024-02-11 DIAGNOSIS — F4322 Adjustment disorder with anxiety: Secondary | ICD-10-CM | POA: Diagnosis not present

## 2024-02-14 ENCOUNTER — Other Ambulatory Visit (HOSPITAL_COMMUNITY): Payer: Self-pay

## 2024-02-14 DIAGNOSIS — F419 Anxiety disorder, unspecified: Secondary | ICD-10-CM | POA: Diagnosis not present

## 2024-02-14 MED ORDER — ESCITALOPRAM OXALATE 20 MG PO TABS
20.0000 mg | ORAL_TABLET | Freq: Every day | ORAL | 3 refills | Status: AC
  Filled 2024-05-03: qty 90, 90d supply, fill #0

## 2024-02-25 DIAGNOSIS — F4322 Adjustment disorder with anxiety: Secondary | ICD-10-CM | POA: Diagnosis not present

## 2024-03-11 DIAGNOSIS — F411 Generalized anxiety disorder: Secondary | ICD-10-CM | POA: Diagnosis not present

## 2024-03-18 DIAGNOSIS — L309 Dermatitis, unspecified: Secondary | ICD-10-CM | POA: Diagnosis not present

## 2024-03-18 DIAGNOSIS — L821 Other seborrheic keratosis: Secondary | ICD-10-CM | POA: Diagnosis not present

## 2024-03-18 DIAGNOSIS — D2230 Melanocytic nevi of unspecified part of face: Secondary | ICD-10-CM | POA: Diagnosis not present

## 2024-03-18 DIAGNOSIS — L814 Other melanin hyperpigmentation: Secondary | ICD-10-CM | POA: Diagnosis not present

## 2024-03-18 DIAGNOSIS — D225 Melanocytic nevi of trunk: Secondary | ICD-10-CM | POA: Diagnosis not present

## 2024-03-18 DIAGNOSIS — L578 Other skin changes due to chronic exposure to nonionizing radiation: Secondary | ICD-10-CM | POA: Diagnosis not present

## 2024-03-18 DIAGNOSIS — D239 Other benign neoplasm of skin, unspecified: Secondary | ICD-10-CM | POA: Diagnosis not present

## 2024-03-20 DIAGNOSIS — F411 Generalized anxiety disorder: Secondary | ICD-10-CM | POA: Diagnosis not present

## 2024-03-27 DIAGNOSIS — F411 Generalized anxiety disorder: Secondary | ICD-10-CM | POA: Diagnosis not present

## 2024-04-09 DIAGNOSIS — F411 Generalized anxiety disorder: Secondary | ICD-10-CM | POA: Diagnosis not present

## 2024-04-22 DIAGNOSIS — F411 Generalized anxiety disorder: Secondary | ICD-10-CM | POA: Diagnosis not present

## 2024-04-28 DIAGNOSIS — F411 Generalized anxiety disorder: Secondary | ICD-10-CM | POA: Diagnosis not present

## 2024-05-03 ENCOUNTER — Other Ambulatory Visit (HOSPITAL_COMMUNITY): Payer: Self-pay

## 2024-05-05 ENCOUNTER — Other Ambulatory Visit (HOSPITAL_COMMUNITY): Payer: Self-pay

## 2024-05-07 ENCOUNTER — Other Ambulatory Visit (HOSPITAL_COMMUNITY): Payer: Self-pay

## 2024-05-12 ENCOUNTER — Encounter (HOSPITAL_COMMUNITY): Payer: Self-pay

## 2024-05-12 ENCOUNTER — Other Ambulatory Visit (HOSPITAL_COMMUNITY): Payer: Self-pay

## 2024-05-12 MED ORDER — PROCTOFOAM HC 1-1 % EX FOAM
1.0000 | Freq: Three times a day (TID) | CUTANEOUS | 0 refills | Status: AC | PRN
  Filled 2024-05-12: qty 10, 11d supply, fill #0

## 2024-05-13 ENCOUNTER — Encounter (HOSPITAL_COMMUNITY): Payer: Self-pay

## 2024-05-13 ENCOUNTER — Other Ambulatory Visit (HOSPITAL_COMMUNITY): Payer: Self-pay

## 2024-05-13 MED ORDER — ANASTROZOLE 1 MG PO TABS
1.0000 mg | ORAL_TABLET | Freq: Every day | ORAL | 3 refills | Status: AC
  Filled 2024-05-13: qty 90, 90d supply, fill #0

## 2024-05-14 ENCOUNTER — Other Ambulatory Visit (HOSPITAL_COMMUNITY): Payer: Self-pay

## 2024-05-14 ENCOUNTER — Encounter (HOSPITAL_COMMUNITY): Payer: Self-pay

## 2024-05-31 ENCOUNTER — Telehealth: Admitting: Family Medicine

## 2024-05-31 DIAGNOSIS — J40 Bronchitis, not specified as acute or chronic: Secondary | ICD-10-CM | POA: Diagnosis not present

## 2024-05-31 MED ORDER — BENZONATATE 200 MG PO CAPS: 200.0000 mg | ORAL_CAPSULE | Freq: Three times a day (TID) | ORAL | 0 refills | Status: AC | PRN

## 2024-05-31 MED ORDER — DOXYCYCLINE HYCLATE 100 MG PO TABS: 100.0000 mg | ORAL_TABLET | Freq: Two times a day (BID) | ORAL | 0 refills | Status: AC

## 2024-05-31 NOTE — Progress Notes (Signed)
 We are sorry that you are not feeling well.  Here is how we plan to help!  Based on your presentation I believe you most likely have A cough due to a virus.  This is called viral bronchitis and is best treated by rest, plenty of fluids and control of the cough.  You may use Ibuprofen  or Tylenol  as directed to help your symptoms.     In addition you may use A prescription cough medication called Tessalon  Perles 100mg . You may take 1-2 capsules every 8 hours as needed for your cough.  I have also sent doxycycline .  From your responses in the eVisit questionnaire you describe inflammation in the upper respiratory tract which is causing a significant cough.  This is commonly called Bronchitis and has four common causes:   Allergies Viral Infections Acid Reflux Bacterial Infection Allergies, viruses and acid reflux are treated by controlling symptoms or eliminating the cause. An example might be a cough caused by taking certain blood pressure medications. You stop the cough by changing the medication. Another example might be a cough caused by acid reflux. Controlling the reflux helps control the cough.  USE OF BRONCHODILATOR (RESCUE) INHALERS: There is a risk from using your bronchodilator too frequently.  The risk is that over-reliance on a medication which only relaxes the muscles surrounding the breathing tubes can reduce the effectiveness of medications prescribed to reduce swelling and congestion of the tubes themselves.  Although you feel brief relief from the bronchodilator inhaler, your asthma may actually be worsening with the tubes becoming more swollen and filled with mucus.  This can delay other crucial treatments, such as oral steroid medications. If you need to use a bronchodilator inhaler daily, several times per day, you should discuss this with your provider.  There are probably better treatments that could be used to keep your asthma under control.     HOME CARE Only take  medications as instructed by your medical team. Complete the entire course of an antibiotic. Drink plenty of fluids and get plenty of rest. Avoid close contacts especially the very young and the elderly Cover your mouth if you cough or cough into your sleeve. Always remember to wash your hands A steam or ultrasonic humidifier can help congestion.   GET HELP RIGHT AWAY IF: You develop worsening fever. You become short of breath You cough up blood. Your symptoms persist after you have completed your treatment plan MAKE SURE YOU  Understand these instructions. Will watch your condition. Will get help right away if you are not doing well or get worse.  Your e-visit answers were reviewed by a board certified advanced clinical practitioner to complete your personal care plan.  Depending on the condition, your plan could have included both over the counter or prescription medications. If there is a problem please reply  once you have received a response from your provider. Your safety is important to us .  If you have drug allergies check your prescription carefully.    You can use MyChart to ask questions about todays visit, request a non-urgent call back, or ask for a work or school excuse for 24 hours related to this e-Visit. If it has been greater than 24 hours you will need to follow up with your provider, or enter a new e-Visit to address those concerns. You will get an e-mail in the next two days asking about your experience.  I hope that your e-visit has been valuable and will speed your recovery. Thank  you for using e-visits.   I have spent 5 minutes in review of e-visit questionnaire, review and updating patient chart, medical decision making and response to patient.   Ashanti Ratti, FNP

## 2024-06-06 ENCOUNTER — Other Ambulatory Visit (HOSPITAL_COMMUNITY): Payer: Self-pay

## 2024-06-06 MED ORDER — AZITHROMYCIN 250 MG PO TABS
ORAL_TABLET | ORAL | 0 refills | Status: AC
  Filled 2024-06-06: qty 6, 5d supply, fill #0

## 2024-06-06 MED ORDER — ALBUTEROL SULFATE HFA 108 (90 BASE) MCG/ACT IN AERS
1.0000 | INHALATION_SPRAY | RESPIRATORY_TRACT | 0 refills | Status: AC | PRN
  Filled 2024-06-06: qty 6.7, 25d supply, fill #0

## 2024-06-06 MED ORDER — FLUCONAZOLE 150 MG PO TABS
150.0000 mg | ORAL_TABLET | ORAL | 0 refills | Status: AC
  Filled 2024-06-06: qty 2, 3d supply, fill #0

## 2024-06-06 MED ORDER — HYDROCODONE BIT-HOMATROP MBR 5-1.5 MG/5ML PO SOLN
5.0000 mL | Freq: Four times a day (QID) | ORAL | 0 refills | Status: AC | PRN
  Filled 2024-06-06: qty 120, 6d supply, fill #0

## 2024-06-06 MED ORDER — PREDNISONE 10 MG PO TABS
ORAL_TABLET | ORAL | 0 refills | Status: AC
  Filled 2024-06-06: qty 21, 6d supply, fill #0
